# Patient Record
Sex: Female | Born: 1965 | Race: Black or African American | Hispanic: No | Smoking: Never smoker
Health system: Southern US, Community
[De-identification: ages and names within clinical notes are randomized; demographics above are authoritative.]

## PROBLEM LIST (undated history)

## (undated) DIAGNOSIS — I10 Essential (primary) hypertension: Secondary | ICD-10-CM

## (undated) DIAGNOSIS — M1990 Unspecified osteoarthritis, unspecified site: Secondary | ICD-10-CM

## (undated) DIAGNOSIS — D649 Anemia, unspecified: Secondary | ICD-10-CM

## (undated) DIAGNOSIS — K219 Gastro-esophageal reflux disease without esophagitis: Secondary | ICD-10-CM

## (undated) HISTORY — PX: SHOULDER SURGERY: SHX246

## (undated) HISTORY — PX: TUBAL LIGATION: SHX77

## (undated) HISTORY — PX: ABDOMINAL HYSTERECTOMY: SHX81

## (undated) HISTORY — PX: WISDOM TOOTH EXTRACTION: SHX21

## (undated) HISTORY — PX: ANKLE SURGERY: SHX546

---

## 1999-05-30 ENCOUNTER — Ambulatory Visit (HOSPITAL_BASED_OUTPATIENT_CLINIC_OR_DEPARTMENT_OTHER): Admission: RE | Admit: 1999-05-30 | Discharge: 1999-05-31 | Payer: Self-pay | Admitting: Orthopedic Surgery

## 1999-08-14 ENCOUNTER — Other Ambulatory Visit: Admission: RE | Admit: 1999-08-14 | Discharge: 1999-08-14 | Payer: Self-pay | Admitting: Family Medicine

## 2005-01-26 ENCOUNTER — Emergency Department (HOSPITAL_COMMUNITY): Admission: EM | Admit: 2005-01-26 | Discharge: 2005-01-26 | Payer: Self-pay | Admitting: Emergency Medicine

## 2005-04-06 ENCOUNTER — Emergency Department (HOSPITAL_COMMUNITY): Admission: EM | Admit: 2005-04-06 | Discharge: 2005-04-06 | Payer: Self-pay | Admitting: *Deleted

## 2006-01-30 ENCOUNTER — Ambulatory Visit: Payer: Self-pay | Admitting: Internal Medicine

## 2006-03-21 ENCOUNTER — Ambulatory Visit: Payer: Self-pay | Admitting: Internal Medicine

## 2006-03-21 ENCOUNTER — Encounter (INDEPENDENT_AMBULATORY_CARE_PROVIDER_SITE_OTHER): Payer: Self-pay | Admitting: Internal Medicine

## 2006-03-21 LAB — CONVERTED CEMR LAB

## 2006-10-08 ENCOUNTER — Encounter: Payer: Self-pay | Admitting: Internal Medicine

## 2006-10-08 DIAGNOSIS — D508 Other iron deficiency anemias: Secondary | ICD-10-CM | POA: Insufficient documentation

## 2006-10-08 DIAGNOSIS — A5901 Trichomonal vulvovaginitis: Secondary | ICD-10-CM | POA: Insufficient documentation

## 2007-06-18 ENCOUNTER — Ambulatory Visit (HOSPITAL_COMMUNITY): Admission: RE | Admit: 2007-06-18 | Discharge: 2007-06-18 | Payer: Self-pay | Admitting: Obstetrics and Gynecology

## 2007-06-19 ENCOUNTER — Ambulatory Visit (HOSPITAL_COMMUNITY): Admission: RE | Admit: 2007-06-19 | Discharge: 2007-06-19 | Payer: Self-pay | Admitting: Obstetrics and Gynecology

## 2007-11-05 ENCOUNTER — Ambulatory Visit (HOSPITAL_COMMUNITY): Admission: RE | Admit: 2007-11-05 | Discharge: 2007-11-05 | Payer: Self-pay | Admitting: Obstetrics and Gynecology

## 2009-01-06 ENCOUNTER — Ambulatory Visit (HOSPITAL_COMMUNITY): Admission: RE | Admit: 2009-01-06 | Discharge: 2009-01-06 | Payer: Self-pay | Admitting: Obstetrics and Gynecology

## 2009-01-06 ENCOUNTER — Emergency Department (HOSPITAL_COMMUNITY): Admission: EM | Admit: 2009-01-06 | Discharge: 2009-01-06 | Payer: Self-pay | Admitting: Emergency Medicine

## 2009-01-18 ENCOUNTER — Encounter: Admission: RE | Admit: 2009-01-18 | Discharge: 2009-01-18 | Payer: Self-pay | Admitting: Obstetrics and Gynecology

## 2009-04-03 ENCOUNTER — Encounter: Payer: Self-pay | Admitting: Obstetrics and Gynecology

## 2009-04-03 ENCOUNTER — Inpatient Hospital Stay (HOSPITAL_COMMUNITY): Admission: RE | Admit: 2009-04-03 | Discharge: 2009-04-05 | Payer: Self-pay | Admitting: Obstetrics and Gynecology

## 2009-05-28 IMAGING — MG MM DIGITAL SCREENING BILAT
6 series · 6 of 6 positions shown · non-contrast
Comparison: none

DG SCREEN MAMMOGRAM BILATERAL
Bilateral CC and MLO view(s) were taken.

DIGITAL SCREENING MAMMOGRAM WITH CAD:
The breast tissue is heterogeneously dense.  No masses or malignant type calcifications are 
identified.

[R CC]
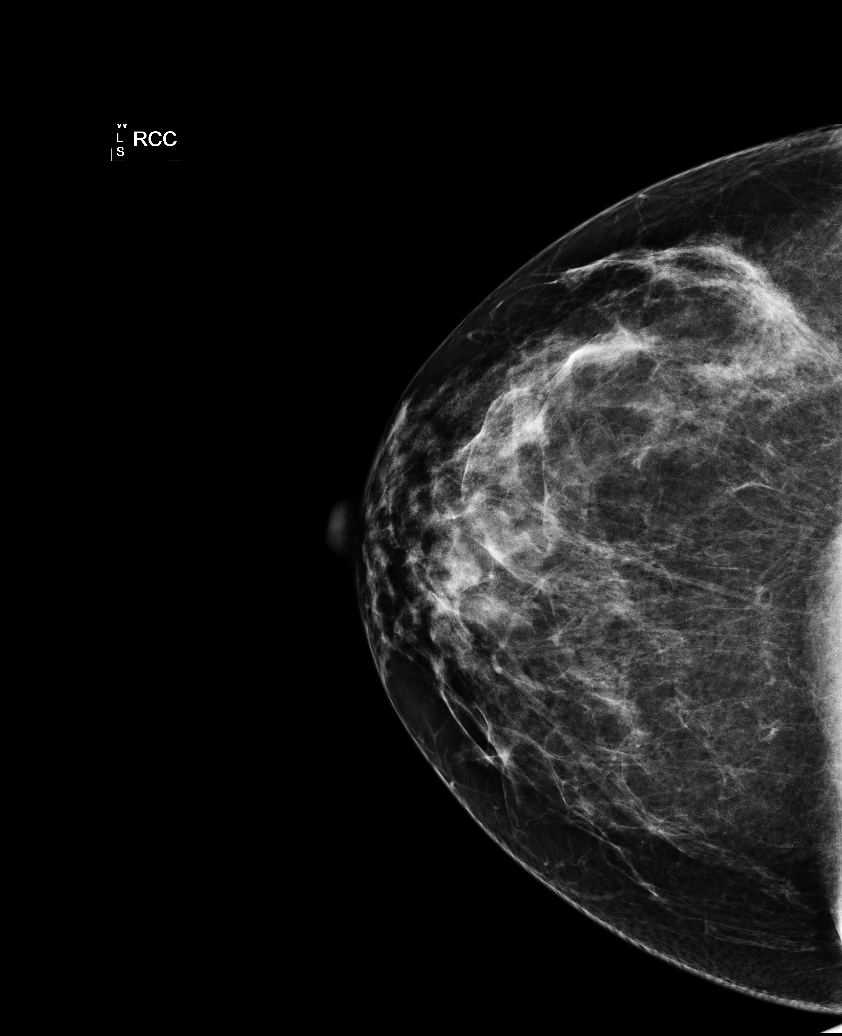

[R MLO (1 of 2)]
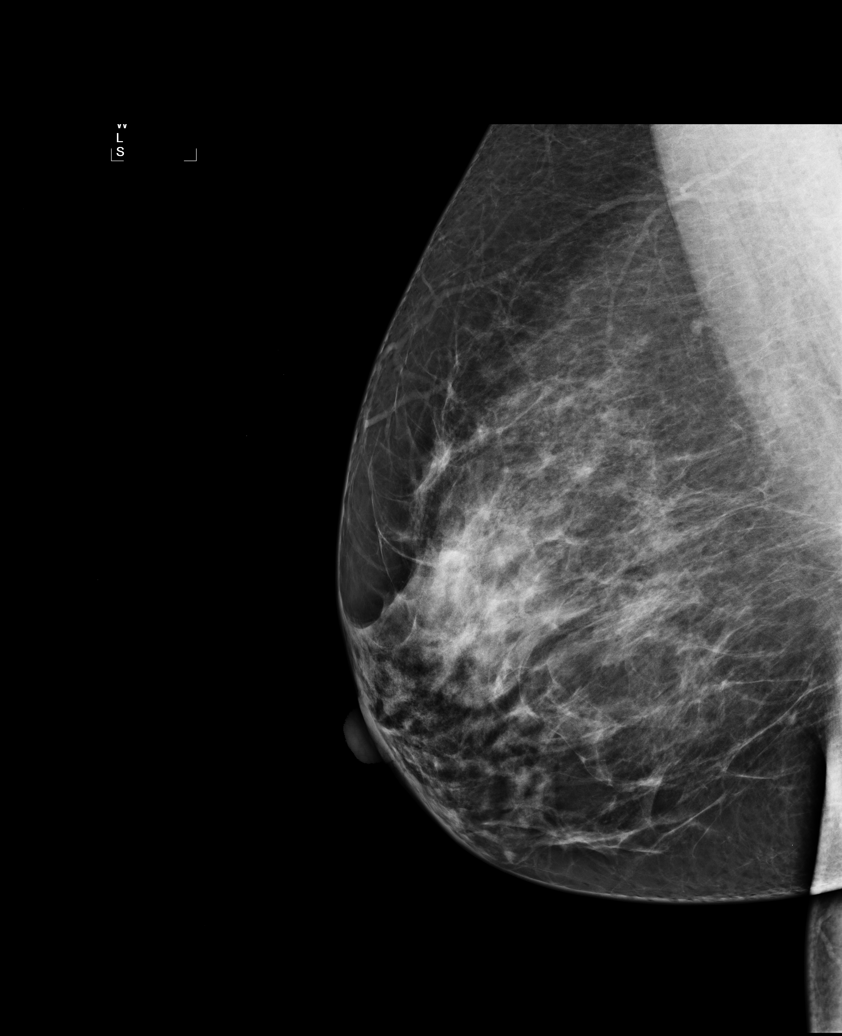

[L CC]
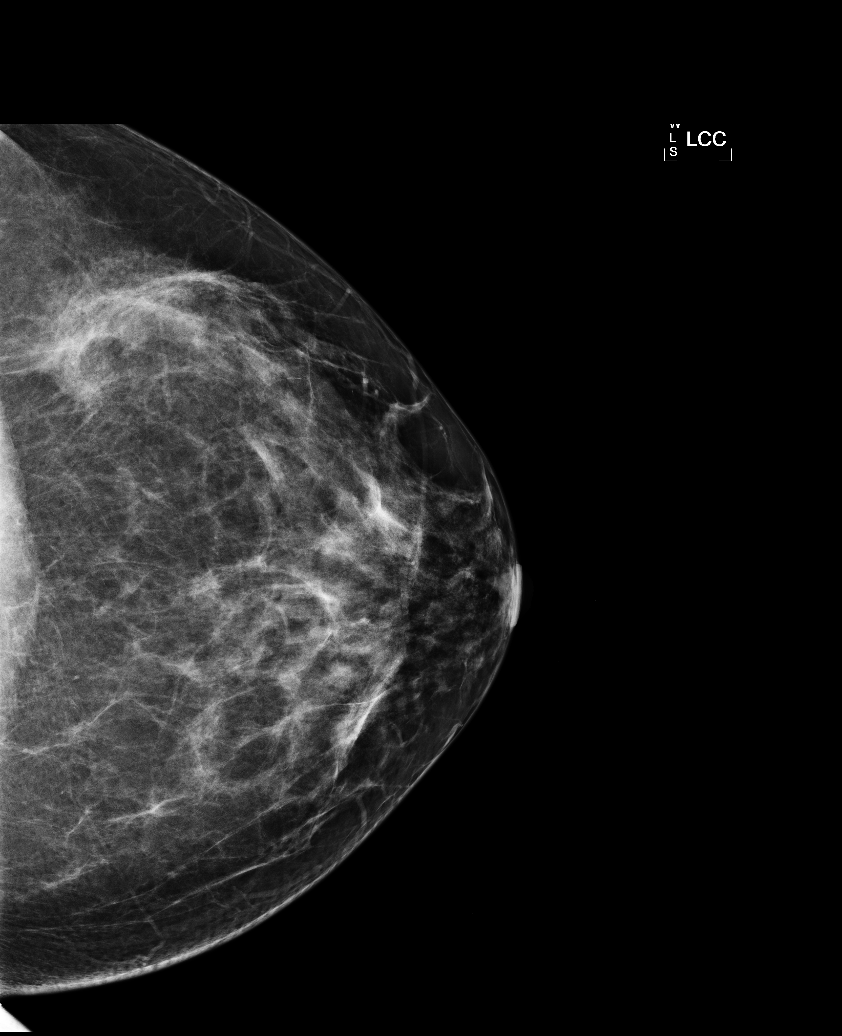

[L MLO (1 of 2)]
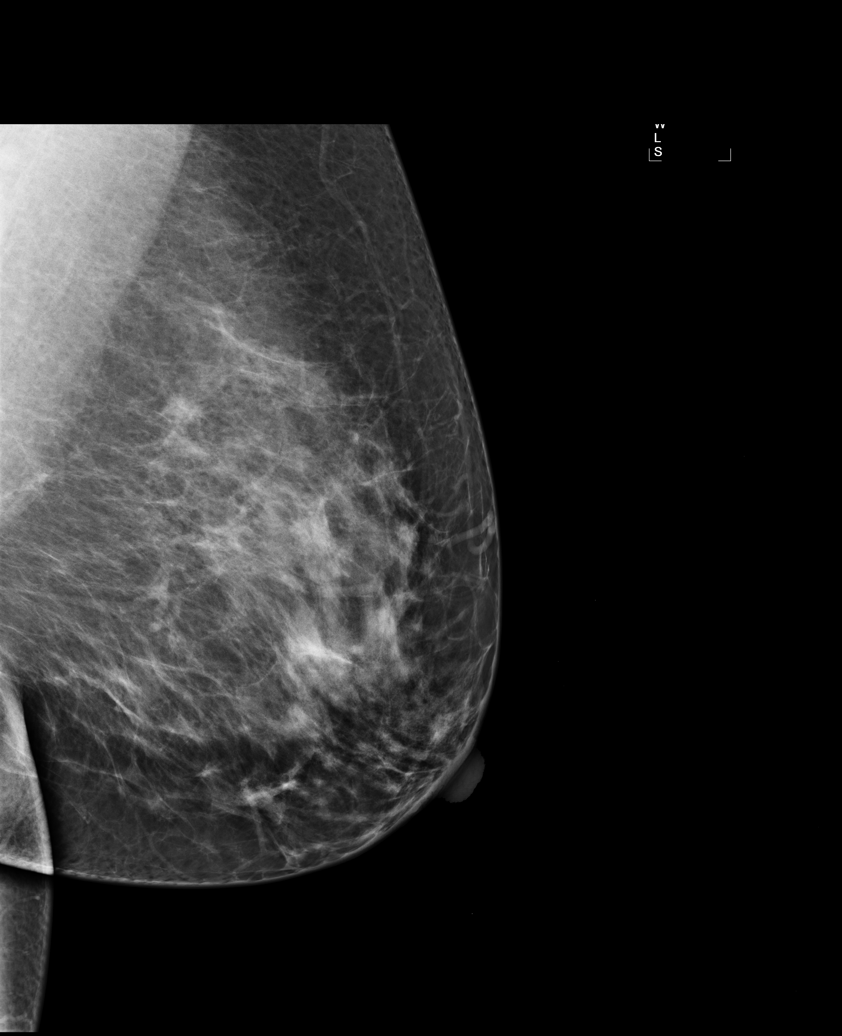

[L MLO (2 of 2)]
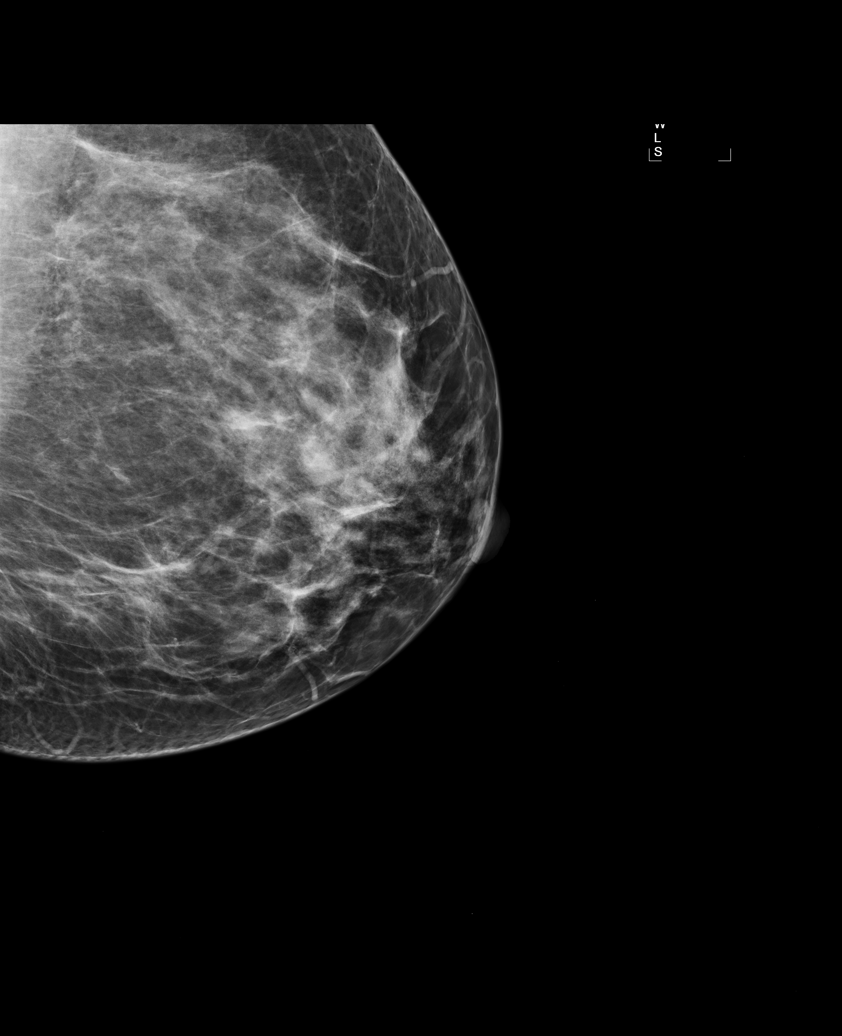

[R MLO (2 of 2)]
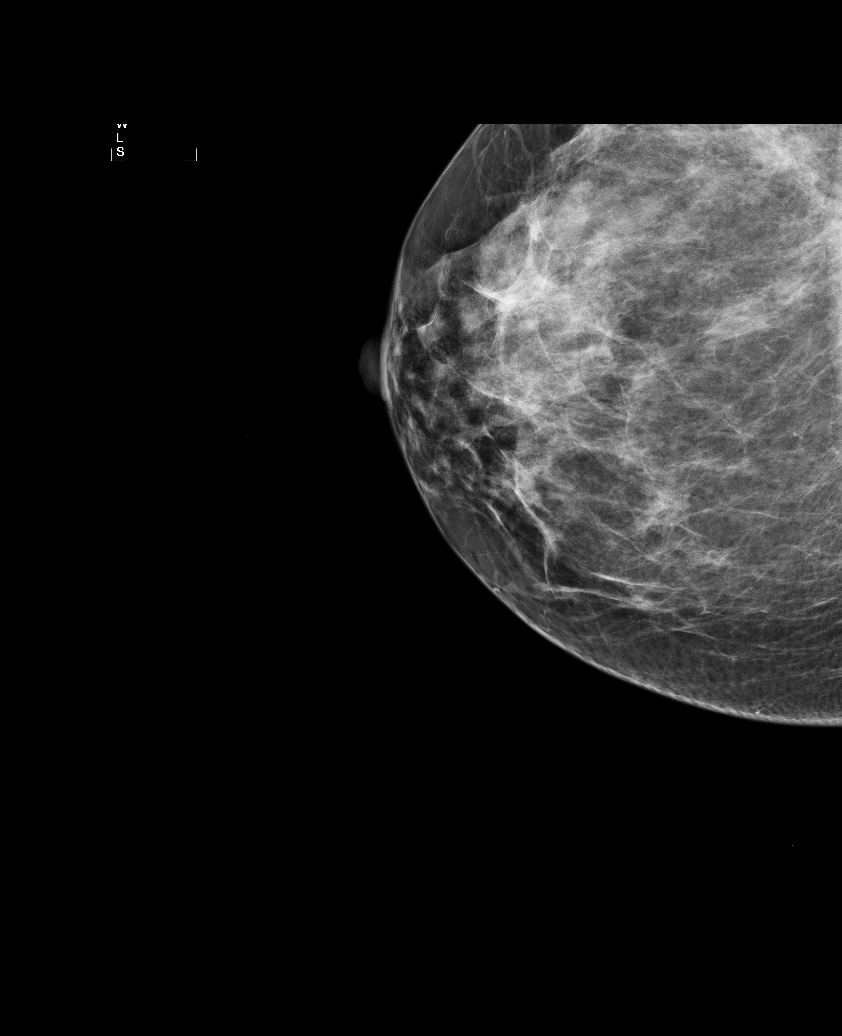

[6 of 6 positions shown; findings below may reference images not displayed]

IMPRESSION: No specific mammographic evidence of malignancy.  Next screening mammogram is recommended in one 
year.

ASSESSMENT: Negative - BI-RADS 1

Screening mammogram in 1 year.
ANALYZED BY COMPUTER AIDED DETECTION. , THIS PROCEDURE WAS A DIGITAL MAMMOGRAM.

## 2010-02-11 ENCOUNTER — Encounter: Payer: Self-pay | Admitting: Obstetrics and Gynecology

## 2010-04-16 LAB — CBC
HCT: 30.7 % — ABNORMAL LOW (ref 36.0–46.0)
MCHC: 32.1 g/dL (ref 30.0–36.0)
MCHC: 32.1 g/dL (ref 30.0–36.0)
MCV: 77.6 fL — ABNORMAL LOW (ref 78.0–100.0)
MCV: 79 fL (ref 78.0–100.0)
Platelets: 270 10*3/uL (ref 150–400)
RBC: 3.28 MIL/uL — ABNORMAL LOW (ref 3.87–5.11)
RBC: 3.96 MIL/uL (ref 3.87–5.11)
WBC: 17.3 10*3/uL — ABNORMAL HIGH (ref 4.0–10.5)

## 2010-06-08 NOTE — Op Note (Signed)
[place]. [facility]  Patient:    Wanda English, Wanda English                         MRN: [id number] Proc. Date: 05/30/99 Adm. Date:  [id number] Disc. Date: [id number] Attending:  Colbert Ewing                           Operative Report  PREOPERATIVE DIAGNOSIS:  Displaced lateral malleolus fracture with torn deltoid ligament, left ankle, with resultant instability.  POSTOPERATIVE DIAGNOSIS:  Displaced lateral malleolus fracture with torn deltoid ligament, left ankle, with resultant instability.  OPERATIVE PROCEDURE:  Closed reduction of subluxation, left ankle, with open reduction and internal fixation of lateral malleolus fracture, six-hole, one-third tubular titanium plate and screws.  SURGEON:  Loreta Ave, M.D.  ASSISTANT:  Arlys John D. Petrarca, P.A.-C.  ANESTHESIA:  General.  ESTIMATED BLOOD LOSS:  Minimal.  TOURNIQUET TIME:  30 minutes.  SPECIMENS:  None.  CULTURES:  None.  DRESSING:  Soft compressive with Cam walker.  DESCRIPTION OF PROCEDURE:  The patient was brought to the operating room and placed on the operating table in the supine position.  After adequate anesthesia had been obtained, the left ankle was examined.  Obvious medial instability with an external rotation injury, widened mortis, and displaced spiral fracture of lateral malleolus.  Syndesmosis not ruptured.  Able to close the medial side, confirming lack of interposition of tissue there. Tourniquet applied about the thigh.  Prepped and draped in usual sterile fashion.  Exsanguinated with elevation of Esmarch.  Tourniquet inflated to 350 mmHg.  Straight incision over the posterolateral aspect of the fibula. Subperiosteal exposure of the fibula, avoiding tendinous and neurovascular structures.  This was reduced anatomically after hematoma evacuated and the wound irrigated.  Fixed with a six-hole one-third tubular plate that was placed on the posterior aspect of the fibula.   The central screw of the three distal screws, as well as the proximal of the three distal screws were overdrilled to allow lagging of the fracture, as well as fixation of the plate.  The plate was fixed with three screws proximal to the fracture and then two across the fracture and one distal.  Used 3.5 mm screws in all of the holes, except for the most distal, which was 4.0 mm.  Care taken not to enter the joint with the drill bits or screws.  At completion, I had excellent anatomic alignment of the entire ankle without instability with stress. Anatomic alignment of the fibula.  No need to intervene on the medial side. The wound was irrigated and then closed with Vicryl and staples.  Margins of the wound injected with Marcaine.  Sterile compressive dressing and Cam walker applied.  Tourniquet deflated and removed.  Anesthesia reversed.  Brought to the recovery room.  Tolerated the surgery well with no complications. DD:  05/30/99 TD:  06/01/99 Job: [id number]

## 2013-09-22 ENCOUNTER — Other Ambulatory Visit: Payer: Self-pay

## 2013-09-22 DIAGNOSIS — Z1231 Encounter for screening mammogram for malignant neoplasm of breast: Secondary | ICD-10-CM

## 2013-10-04 ENCOUNTER — Ambulatory Visit
Admission: RE | Admit: 2013-10-04 | Discharge: 2013-10-04 | Disposition: A | Discharge status: Home / Self Care | Payer: BC Managed Care – PPO | Source: Ambulatory Visit

## 2013-10-04 DIAGNOSIS — Z1231 Encounter for screening mammogram for malignant neoplasm of breast: Secondary | ICD-10-CM

## 2015-05-30 ENCOUNTER — Other Ambulatory Visit: Payer: Self-pay

## 2015-05-30 DIAGNOSIS — Z1231 Encounter for screening mammogram for malignant neoplasm of breast: Secondary | ICD-10-CM

## 2015-06-12 ENCOUNTER — Ambulatory Visit
Admission: RE | Admit: 2015-06-12 | Discharge: 2015-06-12 | Disposition: A | Discharge status: Home / Self Care | Payer: BC Managed Care – PPO | Source: Ambulatory Visit

## 2015-06-12 DIAGNOSIS — Z1231 Encounter for screening mammogram for malignant neoplasm of breast: Secondary | ICD-10-CM

## 2015-07-22 ENCOUNTER — Encounter (HOSPITAL_COMMUNITY): Payer: Self-pay

## 2015-07-22 ENCOUNTER — Emergency Department (HOSPITAL_COMMUNITY): Payer: No Typology Code available for payment source

## 2015-07-22 ENCOUNTER — Emergency Department (HOSPITAL_COMMUNITY)
Admission: EM | Admit: 2015-07-22 | Discharge: 2015-07-22 | Disposition: A | Discharge status: Home / Self Care | Payer: No Typology Code available for payment source | Attending: Emergency Medicine | Admitting: Emergency Medicine

## 2015-07-22 DIAGNOSIS — Y999 Unspecified external cause status: Secondary | ICD-10-CM | POA: Insufficient documentation

## 2015-07-22 DIAGNOSIS — M25512 Pain in left shoulder: Secondary | ICD-10-CM | POA: Diagnosis not present

## 2015-07-22 DIAGNOSIS — Y92410 Unspecified street and highway as the place of occurrence of the external cause: Secondary | ICD-10-CM | POA: Insufficient documentation

## 2015-07-22 DIAGNOSIS — M79672 Pain in left foot: Secondary | ICD-10-CM | POA: Diagnosis not present

## 2015-07-22 DIAGNOSIS — M542 Cervicalgia: Secondary | ICD-10-CM

## 2015-07-22 DIAGNOSIS — Y9389 Activity, other specified: Secondary | ICD-10-CM | POA: Insufficient documentation

## 2015-07-22 DIAGNOSIS — M25532 Pain in left wrist: Secondary | ICD-10-CM | POA: Diagnosis not present

## 2015-07-22 DIAGNOSIS — Z79899 Other long term (current) drug therapy: Secondary | ICD-10-CM | POA: Insufficient documentation

## 2015-07-22 MED ORDER — CYCLOBENZAPRINE HCL 10 MG PO TABS
10.0000 mg | ORAL_TABLET | Freq: Once | ORAL | Status: AC
  Administered 2015-07-22: 10 mg via ORAL
  Filled 2015-07-22: qty 1

## 2015-07-22 MED ORDER — IBUPROFEN 800 MG PO TABS
800.0000 mg | ORAL_TABLET | Freq: Once | ORAL | Status: AC
Start: 2015-07-22 — End: 2015-07-22
  Administered 2015-07-22: 800 mg via ORAL
  Filled 2015-07-22: qty 1

## 2015-07-22 MED ORDER — NAPROXEN 500 MG PO TABS: ORAL_TABLET | ORAL | Status: DC

## 2015-07-22 MED ORDER — CYCLOBENZAPRINE HCL 5 MG PO TABS: 5.0000 mg | ORAL_TABLET | Freq: Three times a day (TID) | ORAL | Status: DC | PRN
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
## 2015-08-04 ENCOUNTER — Other Ambulatory Visit: Payer: Self-pay | Admitting: Gastroenterology

## 2015-09-05 ENCOUNTER — Emergency Department (HOSPITAL_BASED_OUTPATIENT_CLINIC_OR_DEPARTMENT_OTHER): Payer: BC Managed Care – PPO

## 2015-09-05 ENCOUNTER — Encounter (HOSPITAL_BASED_OUTPATIENT_CLINIC_OR_DEPARTMENT_OTHER): Payer: Self-pay | Admitting: *Deleted

## 2015-09-05 ENCOUNTER — Emergency Department (HOSPITAL_BASED_OUTPATIENT_CLINIC_OR_DEPARTMENT_OTHER)
Admission: EM | Admit: 2015-09-05 | Discharge: 2015-09-05 | Disposition: A | Discharge status: Home / Self Care | Payer: BC Managed Care – PPO | Attending: Emergency Medicine | Admitting: Emergency Medicine

## 2015-09-05 DIAGNOSIS — R51 Headache: Secondary | ICD-10-CM | POA: Diagnosis present

## 2015-09-05 DIAGNOSIS — G44219 Episodic tension-type headache, not intractable: Secondary | ICD-10-CM

## 2015-09-05 LAB — CBC WITH DIFFERENTIAL/PLATELET
BASOS ABS: 0 10*3/uL (ref 0.0–0.1)
BASOS PCT: 0 %
EOS ABS: 0.2 10*3/uL (ref 0.0–0.7)
EOS PCT: 2 %
HCT: 39.8 % (ref 36.0–46.0)
Hemoglobin: 13.2 g/dL (ref 12.0–15.0)
LYMPHS PCT: 36 %
Lymphs Abs: 3 10*3/uL (ref 0.7–4.0)
MCH: 28.8 pg (ref 26.0–34.0)
MCHC: 33.2 g/dL (ref 30.0–36.0)
MCV: 86.9 fL (ref 78.0–100.0)
Monocytes Absolute: 1 10*3/uL (ref 0.1–1.0)
Monocytes Relative: 12 %
Neutro Abs: 4.3 10*3/uL (ref 1.7–7.7)
Neutrophils Relative %: 50 %
PLATELETS: 261 10*3/uL (ref 150–400)
RBC: 4.58 MIL/uL (ref 3.87–5.11)
RDW: 14.6 % (ref 11.5–15.5)
WBC: 8.5 10*3/uL (ref 4.0–10.5)

## 2015-09-05 LAB — URINALYSIS, ROUTINE W REFLEX MICROSCOPIC
BILIRUBIN URINE: NEGATIVE
Glucose, UA: NEGATIVE mg/dL
HGB URINE DIPSTICK: NEGATIVE
Ketones, ur: NEGATIVE mg/dL
LEUKOCYTES UA: NEGATIVE
Nitrite: NEGATIVE
PH: 6 (ref 5.0–8.0)
Protein, ur: NEGATIVE mg/dL
SPECIFIC GRAVITY, URINE: 1.02 (ref 1.005–1.030)

## 2015-09-05 LAB — COMPREHENSIVE METABOLIC PANEL
ALBUMIN: 3.4 g/dL — AB (ref 3.5–5.0)
ALT: 14 U/L (ref 14–54)
AST: 15 U/L (ref 15–41)
Alkaline Phosphatase: 79 U/L (ref 38–126)
Anion gap: 7 (ref 5–15)
BUN: 10 mg/dL (ref 6–20)
CHLORIDE: 103 mmol/L (ref 101–111)
CO2: 26 mmol/L (ref 22–32)
CREATININE: 1.07 mg/dL — AB (ref 0.44–1.00)
Calcium: 8.8 mg/dL — ABNORMAL LOW (ref 8.9–10.3)
GFR calc Af Amer: >60 mL/min (ref >60)
GFR calc non Af Amer: 59 mL/min — ABNORMAL LOW (ref >60)
GLUCOSE: 91 mg/dL (ref 65–99)
POTASSIUM: 3.8 mmol/L (ref 3.5–5.1)
SODIUM: 136 mmol/L (ref 135–145)
Total Bilirubin: 0.4 mg/dL (ref 0.3–1.2)
Total Protein: 7.5 g/dL (ref 6.5–8.1)

## 2015-09-05 LAB — LIPASE, BLOOD: Lipase: 23 U/L (ref 11–51)

## 2015-09-05 MED ORDER — NAPROXEN 500 MG PO TABS: 500.0000 mg | ORAL_TABLET | Freq: Two times a day (BID) | ORAL | 0 refills | Status: DC

## 2015-09-05 MED ORDER — SODIUM CHLORIDE 0.9 % IV BOLUS (SEPSIS)
1000.0000 mL | Freq: Once | INTRAVENOUS | Status: AC
  Administered 2015-09-05: 1000 mL via INTRAVENOUS

## 2015-09-05 MED ORDER — PROCHLORPERAZINE EDISYLATE 5 MG/ML IJ SOLN
10.0000 mg | Freq: Once | INTRAMUSCULAR | Status: AC
  Administered 2015-09-05: 10 mg via INTRAVENOUS
  Filled 2015-09-05: qty 2

## 2015-09-05 MED ORDER — DIPHENHYDRAMINE HCL 50 MG/ML IJ SOLN
25.0000 mg | Freq: Once | INTRAMUSCULAR | Status: AC
  Administered 2015-09-05: 25 mg via INTRAVENOUS
  Filled 2015-09-05: qty 1

## 2015-09-05 MED ORDER — DEXAMETHASONE SODIUM PHOSPHATE 10 MG/ML IJ SOLN
10.0000 mg | Freq: Once | INTRAMUSCULAR | Status: AC
  Administered 2015-09-05: 10 mg via INTRAVENOUS
  Filled 2015-09-05: qty 1

## 2015-09-05 NOTE — Discharge Instructions (Signed)
You have been seen today for a headache. Your imaging and lab tests showed no acute abnormalities. Follow up with neurology as soon as possible for reevaluation and chronic management of this issue. Follow up with PCP as needed. Return to ED should symptoms worsen. Use ibuprofen, naproxen, or Tylenol for pain.

## 2015-09-05 NOTE — ED Triage Notes (Signed)
Headache for a week. Nausea and vomiting. Lightheaded x 2 days.

## 2015-09-05 NOTE — ED Provider Notes (Signed)
[facility] Provider Note   CSN: [id number] Arrival date & time: 09/05/15  1424     History   Chief Complaint Chief Complaint  Patient presents with  . Migraine    HPI Wanda GableBarbara P English is a 50 y.o. female.  Wanda GableBarbara P Elsbernd Patient is a 50 year old female who presents emergency Department with chief complaint of headache. Patient states that her headache began about one week week ago. She describes it as throbbing pain behind both of her eyes and neck pain. She states that it has been progressively worsening over the last week. 3 days ago she began having nausea and vomiting, which she associated with the headache. She states it is improved 2 days ago, but yesterday she went to have her neck adjusted by the chiropractor and her headache improved somewhat but then returned and became significantly worse again. She has tried ibuprofen, Claritin and 12.5 mg of Benadryl at bed time without relief of her symptoms. She has no previous history of headaches. She has no history of migraines. Denies photophobia, phonophobia, UL throbbing, N/V, visual changes, stiff neck, neck pain, rash, or "thunderclap" onset.     The history is provided by the patient.  Migraine  The current episode started more than 1 week ago. The problem occurs constantly. The problem has been gradually worsening. Associated symptoms include headaches. Pertinent negatives include no chest pain, no abdominal pain and no shortness of breath. Associated symptoms comments: Intermittent blurry vision vomiting. Nothing aggravates the symptoms. Relieved by: mild relief with Ibuprofen.    History reviewed. No pertinent past medical history.  Patient Active Problem List   Diagnosis Date Noted  . 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 ANEMIA, IRON DEFICIENCY NEC 10/08/2006    Past Surgical History:  Procedure Laterality Date  . ABDOMINAL HYSTERECTOMY    . ANKLE SURGERY    . TUBAL LIGATION      OB History    No  data available       Home Medications    Prior to Admission medications   Medication Sig Start Date End Date Taking? Authorizing Provider  cyclobenzaprine (FLEXERIL) 5 MG tablet Take 1 tablet (5 mg total) by mouth 3 (three) times daily as needed (muscle soreness). 07/22/15   Devoria AlbeIva Knapp, MD  fluticasone-salmeterol (ADVAIR HFA) [phone] MCG/ACT inhaler Inhale 2 puffs into the lungs 2 (two) times daily.    Historical Provider, MD  loratadine (CLARITIN) 10 MG tablet Take 10 mg by mouth daily.    Historical Provider, MD  naproxen (NAPROSYN) 500 MG tablet Take 1 po BID with food prn pain 07/22/15   Devoria AlbeIva Knapp, MD    Family History No family history on file.  Social History Social History  Substance Use Topics  . Smoking status: Never Smoker  . Smokeless tobacco: Never Used  . Alcohol use No     Allergies   Review of patient's allergies indicates no known allergies.   Review of Systems Review of Systems  Respiratory: Negative for shortness of breath.   Cardiovascular: Negative for chest pain.  Gastrointestinal: Negative for abdominal pain.  Neurological: Positive for headaches.     Physical Exam Updated Vital Signs BP 137/87 (BP Location: Left Arm)   Pulse 77   Temp 98.5 F (36.9 C) (Oral)   Resp 18   Ht 5\' 11"  (1.803 m)   Wt 129.3 kg   SpO2 100%   BMI 39.75 kg/m   Physical Exam  Constitutional: She is oriented to person, place, and  time. She appears well-developed and well-nourished. No distress.  HENT:  Head: Normocephalic and atraumatic.  Mouth/Throat: Oropharynx is clear and moist.  Eyes: Conjunctivae and EOM are normal. Pupils are equal, round, and reactive to light. No scleral icterus.  No horizontal, vertical or rotational nystagmus  Neck: Normal range of motion. Neck supple.  Full active and passive ROM without pain No midline or paraspinal tenderness No nuchal rigidity or meningeal signs  Cardiovascular: Normal rate, regular rhythm and intact distal pulses.     Pulmonary/Chest: Effort normal and breath sounds normal. No respiratory distress. She has no wheezes. She has no rales.  Abdominal: Soft. Bowel sounds are normal. There is no tenderness. There is no rebound and no guarding.  Musculoskeletal: Normal range of motion.  Lymphadenopathy:    She has no cervical adenopathy.  Neurological: She is alert and oriented to person, place, and time. She has normal reflexes. No cranial nerve deficit. She exhibits normal muscle tone. Coordination normal.  Mental Status:  Alert, oriented, thought content appropriate. Speech fluent without evidence of aphasia. Able to follow 2 step commands without difficulty.  Cranial Nerves:  II:  Peripheral visual fields grossly normal, pupils equal, round, reactive to light III,IV, VI: ptosis not present, extra-ocular motions intact bilaterally  V,VII: smile symmetric, facial light touch sensation equal VIII: hearing grossly normal bilaterally  IX,X: midline uvula rise  XI: bilateral shoulder shrug equal and strong XII: midline tongue extension  Motor:  5/5 in upper and lower extremities bilaterally including strong and equal grip strength and dorsiflexion/plantar flexion Sensory: Pinprick and light touch normal in all extremities.  Deep Tendon Reflexes: 2+ and symmetric  Cerebellar: normal finger-to-nose with bilateral upper extremities Gait: normal gait and balance CV: distal pulses palpable throughout   Skin: Skin is warm and dry. No rash noted. She is not diaphoretic.  Psychiatric: She has a normal mood and affect. Her behavior is normal. Judgment and thought content normal.  Nursing note and vitals reviewed.    ED Treatments / Results  Labs (all labs ordered are listed, but only abnormal results are displayed) Labs Reviewed - No data to display  EKG  EKG Interpretation None       Radiology No results found.  Procedures Procedures (including critical care time)  Medications Ordered in  ED Medications - No data to display   Initial Impression / Assessment and Plan / ED Course  I have reviewed the triage vital signs and the nursing notes.  Pertinent labs & imaging results that were available during my care of the patient were reviewed by me and considered in my medical decision making (see chart for details).  Clinical Course   Patient with persistent headache. Treated with migraine cocktail. imaging pending . I have given report to PA Joy who will assume care.    Final Clinical Impressions(s) / ED Diagnoses   Final diagnoses:  None    New Prescriptions New Prescriptions   No medications on file     Arthor Captainbigail Virgal [place], PA-C 09/05/15 1609    Melene Planan Floyd, DO 09/07/15 1611

## 2015-09-05 NOTE — ED Provider Notes (Signed)
Norval GableBarbara P Lampley is a 50 y.o. female, patient with no pertinent past medical history, presenting to the ED with a headache for the past week. No history of HA.  HPI from Arthor CaptainAbigail Harris, PA-C: "Patient is a 50 year old female who presents emergency Department with chief complaint of headache. Patient states that her headache began about one week week ago. She describes it as throbbing pain behind both of her eyes and neck pain. She states that it has been progressively worsening over the last week. 3 days ago she began having nausea and vomiting, which she associated with the headache. She states it is improved 2 days ago, but yesterday she went to have her neck adjusted by the chiropractor and her headache improved somewhat but then returned and became significantly worse again. She has tried ibuprofen, Claritin and 12.5 mg of Benadryl at bed time without relief of her symptoms. She has no previous history of headaches. She has no history of migraines. Denies photophobia, phonophobia, UL throbbing, N/V, visual changes, stiff neck, neck pain, rash, or "thunderclap" onset."  History reviewed. No pertinent past medical history.    Physical Exam  BP 137/87 (BP Location: Left Arm)   Pulse 77   Temp 98.5 F (36.9 C) (Oral)   Resp 18   Ht 5\' 11"  (1.803 m)   Wt 129.3 kg   SpO2 100%   BMI 39.75 kg/m   Physical Exam  Constitutional: She is oriented to person, place, and time. She appears well-developed and well-nourished. No distress.  HENT:  Head: Normocephalic and atraumatic.  Eyes: Conjunctivae and EOM are normal. Pupils are equal, round, and reactive to light.    Visual Acuity  Right Eye Distance: 20/25 (with glasses) Left Eye Distance: 20/25 (with glasses) Bilateral Distance: 20/25 (with glasses)  Right Eye Near:   Left Eye Near:    Bilateral Near:     Neck: Normal range of motion. Neck supple.  Cardiovascular: Normal rate, regular rhythm, normal heart sounds and intact distal pulses.    Pulmonary/Chest: Effort normal and breath sounds normal. No respiratory distress.  Abdominal: Soft. There is no tenderness. There is no guarding.  Musculoskeletal: Normal range of motion. She exhibits no edema.  Full ROM in all extremities and spine. No midline spinal tenderness.   Lymphadenopathy:    She has no cervical adenopathy.  Neurological: She is alert and oriented to person, place, and time. She has normal reflexes.  No sensory deficits. Strength 5/5 in all extremities. Coordination intact. Cranial nerves III-XII grossly intact. No facial droop.   Skin: Skin is warm and dry. She is not diaphoretic.  Psychiatric: She has a normal mood and affect. Her behavior is normal.  Nursing note and vitals reviewed.     ED Course  Procedures   Labs Reviewed  COMPREHENSIVE METABOLIC PANEL - Abnormal; Notable for the following:       Result Value   Creatinine, Ser 1.07 (*)    Calcium 8.8 (*)    Albumin 3.4 (*)    GFR calc non Af Amer 59 (*)    All other components within normal limits  CBC WITH DIFFERENTIAL/PLATELET  LIPASE, BLOOD  URINALYSIS, ROUTINE W REFLEX MICROSCOPIC (NOT AT [facility])    Ct Head Wo Contrast  Result Date: 09/05/2015 CLINICAL DATA:  Headache EXAM: CT HEAD WITHOUT CONTRAST TECHNIQUE: Contiguous axial images were obtained from the base of the skull through the vertex without intravenous contrast. COMPARISON:  None. FINDINGS: Brain: Ventricle size normal. Negative for acute hemorrhage. Negative  for acute infarct or mass. Vascular: Negative Skull: Negative Sinuses/Orbits: Slight mucosal edema in the paranasal sinuses. Mastoid sinus clear bilaterally. Other: Normal soft tissues. IMPRESSION: Normal unenhanced CT of the brain. Electronically Signed   By: Marlan Palauharles  Clark M.D.   On: 09/05/2015 17:19    MDM    Took patient care handoff report from Arthor CaptainAbigail Harris, PA-C. Headache for past week. No history of HA.  Plan: review CT and labs. D/C home with NSAID. PCP follow  up.  Findings and plan of care discussed with Loren Raceravid Yelverton. Dr. Ranae PalmsYelverton personally evaluated and examined this patient.  Upon my initial assessment, patient states her pain has greatly improved and is now rated 3 out of 10. Denies additional symptoms. CT shows no abnormalities. Labs without significant abnormalities. No red flag symptoms. No neuro or functional deficits. Suspect tension-type headache versus migraine. Patient to follow up with neurology. The patient was given instructions for home care as well as return precautions. Patient voices understanding of these instructions, accepts the plan, and is comfortable with discharge.  Vitals:   09/05/15 1427 09/05/15 1430 09/05/15 1432 09/05/15 1648  BP:   137/87 106/63  Pulse:   77 88  Resp:  14 18 18   Temp:   98.5 F (36.9 C)   TempSrc:  Oral Oral   SpO2:   100% 100%  Weight: 129.3 kg     Height: 5\' 11"  (1.803 m)      Vitals:   09/05/15 1430 09/05/15 1432 09/05/15 1648 09/05/15 1838  BP:  137/87 106/63 120/79  Pulse:  77 88 85  Resp: 14 18 18 16   Temp:  98.5 F (36.9 C)    TempSrc: Oral Oral    SpO2:  100% 100% 100%  Weight:      Height:            Anselm PancoastShawn C Joy, PA-C 09/05/15 1955    Melene Planan Floyd, DO 09/07/15 1506

## 2015-09-05 NOTE — ED Notes (Signed)
CT made aware pt's lab results are back
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
## 2015-10-13 ENCOUNTER — Telehealth: Payer: Self-pay | Admitting: *Deleted

## 2015-10-13 ENCOUNTER — Ambulatory Visit: Payer: Self-pay | Admitting: Diagnostic Neuroimaging

## 2015-10-13 NOTE — Telephone Encounter (Signed)
No show - new patient appt - called morning of appt to cancel - did not have co-pay.

## 2015-10-16 ENCOUNTER — Encounter: Payer: Self-pay | Admitting: Diagnostic Neuroimaging

## 2016-05-06 ENCOUNTER — Other Ambulatory Visit: Payer: Self-pay | Admitting: Family Medicine

## 2016-05-06 DIAGNOSIS — Z1231 Encounter for screening mammogram for malignant neoplasm of breast: Secondary | ICD-10-CM

## 2016-06-12 ENCOUNTER — Ambulatory Visit: Payer: BC Managed Care – PPO

## 2016-06-25 ENCOUNTER — Ambulatory Visit
Admission: RE | Admit: 2016-06-25 | Discharge: 2016-06-25 | Disposition: A | Discharge status: Home / Self Care | Payer: BC Managed Care – PPO | Source: Ambulatory Visit | Attending: Family Medicine | Admitting: Family Medicine

## 2016-06-25 DIAGNOSIS — Z1231 Encounter for screening mammogram for malignant neoplasm of breast: Secondary | ICD-10-CM

## 2016-10-18 ENCOUNTER — Ambulatory Visit: Payer: Self-pay | Admitting: Surgery

## 2016-10-18 NOTE — H&P (Signed)
History of Present Illness Wanda English. Wanda Izzo MD; 10/18/2016 12:43 PM) The patient is a 51 year old female who presents for evaluation of gall stones. Referred by [name], PA-C for symptomatic gallstones PCP Dr. [name]  This is a 51 year old female who experienced an acute episode of upper abdominal pain about 1 month ago. She was having a steak dinner when she began to have significant nausea and developed severe upper abdominal pain. She began vomiting. She denies any diarrhea. The pain became quite severe and lasted at least 5 days. Reports a low-grade fever during this time. After 3 or 4 days she went to urgent care for evaluation. White count was normal. Liver function tests were normal. Lipase was normal. She was sent for ultrasound which was performed at [facility] on 09/30/16. This showed cholelithiasis with some gallbladder wall thickening. The patient's symptoms have resolved. She does not have any further abdominal tenderness. She is now referred to [place] for surgical evaluation for cholecystectomy. She is a bit reluctant to consider surgery.   Past Surgical History [name], [place]; 10/18/2016 9:57 AM) Hysterectomy (not due to cancer) - Partial Oral Surgery  Diagnostic Studies History [name], [place]; 10/18/2016 9:57 AM) Colonoscopy 1-5 years ago Mammogram within last year Pap Smear >5 years ago  Allergies [name], RMA; 10/18/2016 10:00 AM) No Known Drug Allergies 10/18/2016  Medication History [name], RMA; 10/18/2016 10:00 AM) Promethazine HCl (  Tablet, Oral) Active. Meloxicam (  Tablet, Oral) Active. Gabapentin (  Capsule, Oral) Active. Hydrocodone-Acetaminophen (5-325MG  Tablet, Oral) Active.  Social History [name], [place]; 10/18/2016 9:58 AM) Alcohol use Remotely quit alcohol use. Caffeine use Carbonated beverages, Coffee, Tea. Tobacco use Never smoker.  Family History [name],  [place]; 10/18/2016 9:57 AM) Family history unknown First Degree Relatives  Pregnancy / Birth History [name], [place]; 10/18/2016 9:58 AM) Age at menarche 12 years. Gravida 3 Irregular periods Maternal age 66-20 Para 3  Other Problems [name], [place]; 10/18/2016 9:57 AM) Back Pain Hemorrhoids     Review of Systems [name] RMA; 10/18/2016 9:58 AM) General Not Present- Appetite Loss, Chills, Fatigue, Fever, Night Sweats, Weight Gain and Weight Loss. Skin Not Present- Change in Wart/Mole, Dryness, Hives, Jaundice, New Lesions, Non-Healing Wounds, Rash and Ulcer. HEENT Present- Sinus Pain and Wears glasses/contact lenses. Not Present- Earache, Hearing Loss, Hoarseness, Nose Bleed, Oral Ulcers, Ringing in the Ears, Seasonal Allergies, Sore Throat, Visual Disturbances and Yellow Eyes. Respiratory Not Present- Bloody sputum, Chronic Cough, Difficulty Breathing, Snoring and Wheezing. Breast Not Present- Breast Mass, Breast Pain, Nipple Discharge and Skin Changes. Gastrointestinal Not Present- Abdominal Pain, Bloating, Bloody Stool, Change in Bowel Habits, Chronic diarrhea, Constipation, Difficulty Swallowing, Excessive gas, Gets full quickly at meals, Hemorrhoids, Indigestion, Nausea, Rectal Pain and Vomiting. Female Genitourinary Not Present- Frequency, Nocturia, Painful Urination, Pelvic Pain and Urgency. Musculoskeletal Present- Back Pain, Joint Stiffness and Swelling of Extremities. Not Present- Joint Pain, Muscle Pain and Muscle Weakness. Neurological Not Present- Decreased Memory, Fainting, Headaches, Numbness, Seizures, Tingling, Tremor, Trouble walking and Weakness. Psychiatric Not Present- Anxiety, Bipolar, Change in Sleep Pattern, Depression, Fearful and Frequent crying. Endocrine Present- Hot flashes. Not Present- Cold Intolerance, Excessive Hunger, Hair Changes, Heat Intolerance and New Diabetes. Hematology Not Present- Blood Thinners, Easy Bruising,  Excessive bleeding, Gland problems, HIV and Persistent Infections.  Vitals [name] RMA; 10/18/2016 10:00 AM) 10/18/2016 9:58 AM Weight: 282.2 lb Height: 71in Body Surface Area: 2.44 m Body Mass Index: 39.36 kg/m  Temp.: 96.50F  Pulse: 82 (Regular)  BP:  160/100 (Sitting, Left Arm, Standard)      Physical Exam [name] MD; 10/18/2016 12:43 PM)  The physical exam findings are as follows: Note:WDWN in NAD Eyes: Pupils equal, round; sclera anicteric HENT: Oral mucosa moist; good dentition Neck: No masses palpated, no thyromegaly Lungs: CTA bilaterally; normal respiratory effort CV: Regular rate and rhythm; no murmurs; extremities well-perfused with no edema Abd: +bowel sounds, soft, non-tender, no palpable organomegaly; no palpable hernias Skin: Warm, dry; no sign of jaundice Psychiatric - alert and oriented x 4; calm mood and affect    Assessment & Plan [name] MD; 10/18/2016 10:29 AM)  CHRONIC CHOLECYSTITIS WITH CALCULUS (K80.10)  Current Plans Schedule for Surgery - Laparoscopic cholecystectomy with intraoperative cholangiogram. The surgical procedure has been discussed with the patient. Potential risks, benefits, alternative treatments, and expected outcomes have been explained. All of the patient's questions at this time have been answered. The likelihood of reaching the patient's treatment goal is good. The patient understand the proposed surgical procedure and wishes to proceed.  Wanda English. Corliss Skains, MD, [facility]  General/ Trauma Surgery  10/18/2016 12:43 PM

## 2016-12-03 ENCOUNTER — Other Ambulatory Visit: Payer: Self-pay

## 2016-12-03 ENCOUNTER — Encounter (HOSPITAL_BASED_OUTPATIENT_CLINIC_OR_DEPARTMENT_OTHER): Payer: Self-pay | Admitting: *Deleted

## 2016-12-03 ENCOUNTER — Emergency Department (HOSPITAL_BASED_OUTPATIENT_CLINIC_OR_DEPARTMENT_OTHER)
Admission: EM | Admit: 2016-12-03 | Discharge: 2016-12-03 | Disposition: A | Discharge status: Home / Self Care | Payer: BC Managed Care – PPO | Attending: Physician Assistant | Admitting: Physician Assistant

## 2016-12-03 DIAGNOSIS — Z79899 Other long term (current) drug therapy: Secondary | ICD-10-CM | POA: Diagnosis not present

## 2016-12-03 DIAGNOSIS — R059 Cough, unspecified: Secondary | ICD-10-CM

## 2016-12-03 DIAGNOSIS — R51 Headache: Secondary | ICD-10-CM | POA: Diagnosis not present

## 2016-12-03 DIAGNOSIS — J029 Acute pharyngitis, unspecified: Secondary | ICD-10-CM | POA: Insufficient documentation

## 2016-12-03 DIAGNOSIS — R05 Cough: Secondary | ICD-10-CM | POA: Insufficient documentation

## 2016-12-03 DIAGNOSIS — J3489 Other specified disorders of nose and nasal sinuses: Secondary | ICD-10-CM | POA: Diagnosis present

## 2016-12-03 DIAGNOSIS — J0100 Acute maxillary sinusitis, unspecified: Secondary | ICD-10-CM | POA: Diagnosis not present

## 2016-12-03 MED ORDER — BENZONATATE 100 MG PO CAPS: 100.0000 mg | ORAL_CAPSULE | Freq: Three times a day (TID) | ORAL | 0 refills | Status: DC

## 2016-12-03 MED FILL — BENZONATATE 100 MG CAPSULE: 100 | 7 days supply | Qty: 21 | Fill #0

## 2016-12-03 NOTE — ED Provider Notes (Signed)
[facility] EMERGENCY DEPARTMENT Provider Note   CSN: [id number] Arrival date & time: 12/03/16  1337     History   Chief Complaint Chief Complaint  Patient presents with  . URI    HPI Wanda English is a 51 y.o. female.  Wanda English is a 51 y.o. Female congestion, rhinorrhea and for the past 5 days.  Patient reports symptoms started on Friday with sore throat, which has since improved.  Nasal congestion and cough has gotten progressively worse the patient and her voice was very hoarse and she can barely talk.  Patient reports persistent dry nonproductive cough. Patient also complaining of sinus pressure with associated frontal headache.  Patient denies any fevers or chills.  Denies ear pain or discharge.  Denies any chest pain or shortness of breath.  Reports history of sinus congestion, Claritin, and Tylenol with minimal improvement.       History reviewed. No pertinent past medical history.  Patient Active Problem List   Diagnosis Date Noted  . 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 ANEMIA, IRON DEFICIENCY NEC 10/08/2006    Past Surgical History:  Procedure Laterality Date  . ABDOMINAL HYSTERECTOMY    . ANKLE SURGERY    . TUBAL LIGATION      OB History    No data available       Home Medications    Prior to Admission medications   Medication Sig Start Date End Date Taking? Authorizing Provider  cyclobenzaprine (FLEXERIL) 5 MG tablet Take 1 tablet (5 mg total) by mouth 3 (three) times daily as needed (muscle soreness). 07/22/15   Devoria AlbeKnapp, Iva, MD  fluticasone-salmeterol (ADVAIR HFA) [phone] MCG/ACT inhaler Inhale 2 puffs into the lungs 2 (two) times daily.    [provider]  loratadine (CLARITIN) 10 MG tablet Take 10 mg by mouth daily.    [provider]  naproxen (NAPROSYN) 500 MG tablet Take 1 po BID with food prn pain 07/22/15   Devoria AlbeKnapp, Iva, MD  naproxen (NAPROSYN) 500 MG tablet Take 1 tablet (500 mg total) by mouth 2 (two) times daily.  09/05/15   Anselm PancoastJoy, Shawn C, PA-C    Family History History reviewed. No pertinent family history.  Social History Social History   Tobacco Use  . Smoking status: Never Smoker  . Smokeless tobacco: Never Used  Substance Use Topics  . Alcohol use: No  . Drug use: Not on file     Allergies   Iodinated diagnostic agents   Review of Systems Review of Systems  Constitutional: Negative for chills and fever.  HENT: Positive for congestion, postnasal drip, rhinorrhea, sinus pressure, sinus pain, sneezing and sore throat. Negative for ear discharge, ear pain, trouble swallowing and voice change.   Eyes: Negative for discharge and visual disturbance.  Respiratory: Positive for cough. Negative for chest tightness, shortness of breath and wheezing.   Cardiovascular: Negative for chest pain.  Gastrointestinal: Negative for abdominal pain, nausea and vomiting.  Musculoskeletal: Negative for neck pain and neck stiffness.  Skin: Negative for rash.  Neurological: Positive for headaches.     Physical Exam Updated Vital Signs BP (!) 174/103   Pulse 87   Temp 98.5 F (36.9 C) (Oral)   Resp 16   Ht 5\' 11"  (1.803 m)   Wt 127 kg (280 lb)   SpO2 99%   BMI 39.05 kg/m   Physical Exam  Constitutional: She is oriented to person, place, and time. She appears well-developed and well-nourished. No distress.  HENT:  Head: Normocephalic and atraumatic.  TMs clear with good landmarks, moderate nasal mucosa edema with clear rhinorrhea, posterior oropharynx clear and moist, with some erythema, no edema or exudates  Eyes: EOM are normal. Pupils are equal, round, and reactive to light. Right eye exhibits no discharge. Left eye exhibits no discharge.  Neck: Normal range of motion. Neck supple.  Cardiovascular: Normal rate, regular rhythm and normal heart sounds.  Pulmonary/Chest: Effort normal and breath sounds normal. No stridor. No respiratory distress. She has no wheezes. She has no rales.    Abdominal: Soft. Bowel sounds are normal. She exhibits no distension. There is no tenderness. There is no guarding.  Lymphadenopathy:    She has no cervical adenopathy.  Neurological: She is alert and oriented to person, place, and time. No cranial nerve deficit. Coordination normal.  Skin: Skin is warm and dry. Capillary refill takes less than 2 seconds. She is not diaphoretic.  Psychiatric: She has a normal mood and affect. Her behavior is normal.  Nursing note and vitals reviewed.    ED Treatments / Results  Labs (all labs ordered are listed, but only abnormal results are displayed) Labs Reviewed - No data to display  EKG  EKG Interpretation None       Radiology No results found.  Procedures Procedures (including critical care time)  Medications Ordered in ED Medications - No data to display   Initial Impression / Assessment and Plan / ED Course  I have reviewed the triage vital signs and the nursing notes.  Pertinent labs & imaging results that were available during my care of the patient were reviewed by me and considered in my medical decision making (see chart for details).  Pt presents with nasal congestion and cough. Pt is well appearing and vitals are normal. Lungs CTA on exam. Patients symptoms are consistent with URI with acute sinusitis, likely viral etiology. Discussed that antibiotics are not indicated for viral infections. Pt will be discharged with symptomatic treatment, counseled pt on warm saline nasal flushes. Verbalizes understanding and is agreeable with plan. Pt is hemodynamically stable & in NAD prior to dc.   Final Clinical Impressions(s) / ED Diagnoses   Final diagnoses:  Acute non-recurrent maxillary sinusitis  Cough    ED Discharge Orders        Ordered    benzonatate (TESSALON) 100 MG capsule  Every 8 hours     12/03/16 1443       Legrand RamsFord, Doris Mcgilvery N, PA-C 12/03/16 2042    Abelino DerrickMackuen, Courteney Lyn, MD 12/05/16 1510

## 2016-12-03 NOTE — Discharge Instructions (Signed)
Your symptoms are likely caused by a virus, continue to support your symptoms with over-the-counter medications with decongestants, nasal saline sprays, you may also try a Nettie pot.  Tessalon Perles for cough.  Tylenol or ibuprofen for pain or fever.  If symptoms are not improving through the weekend please follow-up with your primary care doctor.  If you develop worsening symptoms, persistent fevers, headache neck stiffness or other concerning symptoms please return to the ED for sooner evaluation.

## 2016-12-03 NOTE — ED Notes (Signed)
ED Provider at bedside. 

## 2016-12-03 NOTE — ED Triage Notes (Signed)
Pt c/o URi symptoms x 5 days 

## 2017-06-05 ENCOUNTER — Other Ambulatory Visit: Payer: Self-pay | Admitting: Family Medicine

## 2017-06-05 DIAGNOSIS — Z1231 Encounter for screening mammogram for malignant neoplasm of breast: Secondary | ICD-10-CM

## 2017-06-26 ENCOUNTER — Ambulatory Visit
Admission: RE | Admit: 2017-06-26 | Discharge: 2017-06-26 | Disposition: A | Discharge status: Home / Self Care | Payer: BC Managed Care – PPO | Source: Ambulatory Visit | Attending: Family Medicine | Admitting: Family Medicine

## 2017-06-26 DIAGNOSIS — Z1231 Encounter for screening mammogram for malignant neoplasm of breast: Secondary | ICD-10-CM

## 2017-12-29 ENCOUNTER — Encounter (HOSPITAL_BASED_OUTPATIENT_CLINIC_OR_DEPARTMENT_OTHER): Payer: Self-pay

## 2017-12-29 ENCOUNTER — Emergency Department (HOSPITAL_BASED_OUTPATIENT_CLINIC_OR_DEPARTMENT_OTHER)
Admission: EM | Admit: 2017-12-29 | Discharge: 2017-12-29 | Disposition: A | Discharge status: Home / Self Care | Payer: BC Managed Care – PPO | Attending: Emergency Medicine | Admitting: Emergency Medicine

## 2017-12-29 ENCOUNTER — Other Ambulatory Visit: Payer: Self-pay

## 2017-12-29 ENCOUNTER — Emergency Department (HOSPITAL_BASED_OUTPATIENT_CLINIC_OR_DEPARTMENT_OTHER): Payer: BC Managed Care – PPO

## 2017-12-29 DIAGNOSIS — R05 Cough: Secondary | ICD-10-CM | POA: Diagnosis present

## 2017-12-29 DIAGNOSIS — Z79899 Other long term (current) drug therapy: Secondary | ICD-10-CM | POA: Insufficient documentation

## 2017-12-29 DIAGNOSIS — B9789 Other viral agents as the cause of diseases classified elsewhere: Secondary | ICD-10-CM

## 2017-12-29 DIAGNOSIS — J069 Acute upper respiratory infection, unspecified: Secondary | ICD-10-CM | POA: Insufficient documentation

## 2017-12-29 MED ORDER — BENZONATATE 100 MG PO CAPS
100.0000 mg | ORAL_CAPSULE | Freq: Once | ORAL | Status: AC
  Administered 2017-12-29: 100 mg via ORAL
  Filled 2017-12-29: qty 1

## 2017-12-29 MED ORDER — ACETAMINOPHEN 325 MG PO TABS
650.0000 mg | ORAL_TABLET | Freq: Once | ORAL | Status: AC
Start: 2017-12-29 — End: 2017-12-29
  Administered 2017-12-29: 650 mg via ORAL
  Filled 2017-12-29: qty 2

## 2017-12-29 NOTE — Discharge Instructions (Signed)
Your symptoms are likely caused by a viral upper respiratory infection. Antibiotics are not helpful in treating viral infection, the virus should run its course in about 5-7 days. Please make sure you are drinking plenty of fluids. You can treat your symptoms supportively with tylenol/ibuprofen for fevers and pains, Zyrtec and Flonase to help with nasal congestion, and over the counter cough syrups and throat lozenges to help with cough. If your symptoms are not improving please follow up with you Primary doctor.   If you develop persistent fevers, shortness of breath or difficulty breathing, chest pain, severe headache and neck pain, persistent nausea and vomiting or other new or concerning symptoms return to the Emergency department.  

## 2017-12-29 NOTE — ED Triage Notes (Signed)
C/o cough x 5 days-NAD-steady gait

## 2017-12-29 NOTE — ED Provider Notes (Signed)
[facility] EMERGENCY DEPARTMENT Provider Note   CSN: [id number] Arrival date & time: 12/29/17  1752     History   Chief Complaint Chief Complaint  Patient presents with  . Cough    HPI Wanda English is a 52 y.o. female.  Wanda English is a 52 y.o. Female otherwise healthy, presents to the emergency department for evaluation of 5 days of cough.  Patient reports cough is been persistent was initially productive but is now more of a dry hacking cough.  She reports associated rhinorrhea, nasal congestion and postnasal drip and thinks this is why she is likely coughing.  She is also had some mild sore throat.  She denies fevers or chills.  No headache or neck stiffness.  No chest pain or shortness of breath.  No abdominal pain, nausea or vomiting.  She has been using some over-the-counter medications with minimal improvement, reports she uses Flonase intermittently.  No known sick contacts.  Nothing seems to make symptoms better or worse.     History reviewed. No pertinent past medical history.  Patient Active Problem List   Diagnosis Date Noted  . 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 ANEMIA, IRON DEFICIENCY NEC 10/08/2006    Past Surgical History:  Procedure Laterality Date  . ABDOMINAL HYSTERECTOMY    . ANKLE SURGERY    . TUBAL LIGATION       OB History   None      Home Medications    Prior to Admission medications   Medication Sig Start Date End Date Taking? Authorizing Provider  benzonatate (TESSALON) 100 MG capsule Take 1 capsule (100 mg total) every 8 (eight) hours by mouth. 12/03/16   Dartha Lodge, PA-C  cyclobenzaprine (FLEXERIL) 5 MG tablet Take 1 tablet (5 mg total) by mouth 3 (three) times daily as needed (muscle soreness). 07/22/15   Devoria Albe, MD  fluticasone-salmeterol (ADVAIR HFA) [id number] MCG/ACT inhaler Inhale 2 puffs into the lungs 2 (two) times daily.    [provider]  loratadine (CLARITIN) 10 MG tablet Take 10 mg by mouth  daily.    [provider]  naproxen (NAPROSYN) 500 MG tablet Take 1 po BID with food prn pain 07/22/15   Devoria Albe, MD  naproxen (NAPROSYN) 500 MG tablet Take 1 tablet (500 mg total) by mouth 2 (two) times daily. 09/05/15   Anselm Pancoast, PA-C    Family History No family history on file.  Social History Social History   Tobacco Use  . Smoking status: Never Smoker  . Smokeless tobacco: Never Used  Substance Use Topics  . Alcohol use: No  . Drug use: Never     Allergies   Iodinated diagnostic agents   Review of Systems Review of Systems  Constitutional: Negative for chills and fever.  HENT: Positive for congestion, postnasal drip, rhinorrhea and sore throat.   Eyes: Negative for discharge, redness and itching.  Respiratory: Positive for cough. Negative for chest tightness, shortness of breath and wheezing.   Cardiovascular: Negative for chest pain.  Gastrointestinal: Negative for abdominal pain, nausea and vomiting.  Musculoskeletal: Negative for arthralgias and myalgias.  Skin: Negative for color change and rash.  Neurological: Positive for headaches. Negative for dizziness, syncope and light-headedness.     Physical Exam Updated Vital Signs BP (!) 164/97 (BP Location: Right Arm)   Pulse (!) 103   Temp 98.4 F (36.9 C) (Oral)   Resp 14   Ht 5\' 11"  (1.803 m)   Wt 133.8  kg   SpO2 100%   BMI 41.14 kg/m   Physical Exam  Constitutional: She appears well-developed and well-nourished. She does not appear ill. No distress.  HENT:  Head: Normocephalic and atraumatic.  Mouth/Throat: Oropharynx is clear and moist.  TMs clear with good landmarks, moderate nasal mucosa edema with clear rhinorrhea, posterior oropharynx clear and moist, with some erythema, no edema or exudates, uvula midline  Eyes: Right eye exhibits no discharge. Left eye exhibits no discharge.  Neck: Neck supple.  No rigidity  Cardiovascular: Normal rate, regular rhythm, normal heart sounds and  intact distal pulses.  Pulmonary/Chest: Effort normal and breath sounds normal. No respiratory distress.  Respirations equal and unlabored, patient able to speak in full sentences, lungs clear to auscultation bilaterally, occasional cough during exam  Abdominal: Soft. Bowel sounds are normal. She exhibits no distension and no mass. There is no tenderness. There is no guarding.  Abdomen soft, nondistended, nontender to palpation in all quadrants without guarding or peritoneal signs  Musculoskeletal: She exhibits no deformity.  Lymphadenopathy:    She has no cervical adenopathy.  Neurological: She is alert.  Skin: Skin is warm and dry. Capillary refill takes less than 2 seconds. She is not diaphoretic.  Nursing note and vitals reviewed.    ED Treatments / Results  Labs (all labs ordered are listed, but only abnormal results are displayed) Labs Reviewed - No data to display  EKG None  Radiology Dg Chest 2 View  Result Date: 12/29/2017 CLINICAL DATA:  Cough for the past 5 days, worsening. EXAM: CHEST - 2 VIEW COMPARISON:  07/22/2015. FINDINGS: Normal sized heart. Clear lungs. Minimal peribronchial thickening without significant change. Unremarkable bones. IMPRESSION: Stable minimal bronchitic changes. Electronically Signed   By: Beckie SaltsSteven  Reid M.D.   On: 12/29/2017 19:19    Procedures Procedures (including critical care time)  Medications Ordered in ED Medications  benzonatate (TESSALON) capsule 100 mg (100 mg Oral Given 12/29/17 1900)  acetaminophen (TYLENOL) tablet 650 mg (650 mg Oral Given 12/29/17 1900)     Initial Impression / Assessment and Plan / ED Course  I have reviewed the triage vital signs and the nursing notes.  Pertinent labs & imaging results that were available during my care of the patient were reviewed by me and considered in my medical decision making (see chart for details).  Pt presents with nasal congestion and cough. Pt is well appearing and vitals are  normal. Lungs CTA on exam. Pt CXR negative for acute infiltrate. Patients symptoms are consistent with URI, likely viral etiology. Discussed that antibiotics are not indicated for viral infections. Pt will be discharged with symptomatic treatment.  Verbalizes understanding and is agreeable with plan. Pt is hemodynamically stable & in NAD prior to dc.  Vitals:   12/29/17 1756 12/29/17 1757 12/29/17 1939  BP: (!) 164/97  127/70  Pulse: (!) 103  96  Resp: 14  18  Temp: 98.4 F (36.9 C)    TempSrc: Oral    SpO2: 100%  100%  Weight:  133.8 kg   Height:  5\' 11"  (1.803 m)      Final Clinical Impressions(s) / ED Diagnoses   Final diagnoses:  Viral URI with cough    ED Discharge Orders    None       Legrand RamsFord, Ozzie Remmers N, PA-C 12/29/17 Marcella Dubs1958    Plunkett, Whitney, MD 01/02/18 2053

## 2017-12-29 NOTE — ED Notes (Signed)
Patient transported to X-ray 

## 2019-04-28 ENCOUNTER — Other Ambulatory Visit: Payer: Self-pay | Admitting: Family Medicine

## 2019-04-28 DIAGNOSIS — Z1231 Encounter for screening mammogram for malignant neoplasm of breast: Secondary | ICD-10-CM

## 2019-04-30 ENCOUNTER — Other Ambulatory Visit: Payer: Self-pay

## 2019-04-30 ENCOUNTER — Ambulatory Visit
Admission: RE | Admit: 2019-04-30 | Discharge: 2019-04-30 | Disposition: A | Discharge status: Home / Self Care | Payer: BC Managed Care – PPO | Source: Ambulatory Visit | Attending: Family Medicine | Admitting: Family Medicine

## 2019-04-30 DIAGNOSIS — Z1231 Encounter for screening mammogram for malignant neoplasm of breast: Secondary | ICD-10-CM

## 2020-03-30 ENCOUNTER — Other Ambulatory Visit: Payer: Self-pay | Admitting: Family Medicine

## 2020-03-30 DIAGNOSIS — Z1231 Encounter for screening mammogram for malignant neoplasm of breast: Secondary | ICD-10-CM

## 2020-05-24 ENCOUNTER — Ambulatory Visit
Admission: RE | Admit: 2020-05-24 | Discharge: 2020-05-24 | Disposition: A | Discharge status: Home / Self Care | Payer: BC Managed Care – PPO | Source: Ambulatory Visit | Attending: Family Medicine | Admitting: Family Medicine

## 2020-05-24 ENCOUNTER — Other Ambulatory Visit: Payer: Self-pay

## 2020-05-24 DIAGNOSIS — Z1231 Encounter for screening mammogram for malignant neoplasm of breast: Secondary | ICD-10-CM

## 2020-08-21 NOTE — Progress Notes (Addendum)
COVID Vaccine Completed: No Date COVID Vaccine completed: Has received booster: COVID vaccine manufacturer: Pfizer    [employer]   Date of COVID positive in last 90 days:  No  PCP - [name], MD.   Office note on chart Cardiologist - N/A  Chest x-ray - N/A EKG - 08-22-20 Epic Stress Test - N/A ECHO - N/A Cardiac Cath - N/A Pacemaker/ICD device last checked: Spinal Cord Stimulator:  Sleep Study - N/A CPAP -   Fasting Blood Sugar - N/A Checks Blood Sugar _____ times a day  Blood Thinner Instructions:  N/A Aspirin Instructions: Last Dose:  Activity level:  Can go up a flight of stairs and perform activities of daily living without stopping and without symptoms of chest pain or shortness of breath.      Anesthesia review:  BP elevated at PAT 170/98 and 168/90 on recheck.  Stop Bang assessment 5.  Patient denies shortness of breath, fever, cough and chest pain at PAT appointment   Patient verbalized understanding of instructions that were given to them at the PAT appointment. Patient was also instructed that they will need to review over the PAT instructions again at home before surgery.

## 2020-08-21 NOTE — Patient Instructions (Addendum)
DUE TO COVID-19 ONLY ONE VISITOR IS ALLOWED TO COME WITH YOU AND STAY IN THE WAITING ROOM ONLY DURING PRE OP AND PROCEDURE.   **NO VISITORS ARE ALLOWED IN THE SHORT STAY AREA OR RECOVERY ROOM!!**         Your procedure is scheduled on:  Thursday, 08-31-20   Report to [facility] Main  Entrance   Report to admitting at 10:30 AM   Call this number if you have problems the morning of surgery [phone]   Do not eat food :After Midnight.   May have liquids until 9:30 AM day of surgery  CLEAR LIQUID DIET  Foods Allowed                                                                     Foods Excluded  Water, Black Coffee and tea, regular and decaf               liquids that you cannot  Plain Jell-O in any flavor  (No red)                                     see through such as: Fruit ices (not with fruit pulp)                                      milk, soups, orange juice              Iced Popsicles (No red)                                      All solid food                                   Apple juices Sports drinks like Gatorade (No red) Lightly seasoned clear broth or consume(fat free) Sugar, honey syrup      Complete one Ensure drink the morning of surgery at  9:30 AM  the day of surgery.       The day of surgery:  Drink ONE (1) Pre-Surgery Clear Ensure the morning of surgery. Drink in one sitting. Do not sip.  This drink was given to you during your hospital  pre-op appointment visit. Nothing else to drink after completing the Pre-Surgery Clear Ensure.          If you have questions, please contact your surgeon's office.     Oral Hygiene is also important to reduce your risk of infection.                                    Remember - BRUSH YOUR TEETH THE MORNING OF SURGERY WITH YOUR REGULAR TOOTHPASTE   Do NOT smoke after Midnight  Take these medicines the morning of surgery with A SIP OF WATER:  Amlodipine, Loratadine, Pantoprazole.  Okay to use  inhalers/nebulizers  You may not have any metal on your body including hair pins, jewelry, and body piercing             Do not wear make-up, lotions, powders, perfumes/cologne, or deodorant  Do not wear nail polish including gel and S&S, artificial/acrylic nails, or any other type of covering on natural nails including finger and toenails. If you have artificial nails, gel coating, etc. that needs to be removed by a nail salon please have this removed prior to surgery or surgery may need to be canceled/ delayed if the surgeon/ anesthesia feels like they are unable to be safely monitored.   Do not shave  48 hours prior to surgery.             Do not bring valuables to the hospital. [facility] IS NOT   RESPONSIBLE   FOR VALUABLES.   Contacts, dentures or bridgework may not be worn into surgery.    Patients discharged the day of surgery will not be allowed to drive home.    Please read over the following fact sheets you were given: IF YOU HAVE QUESTIONS ABOUT YOUR PRE OP INSTRUCTIONS PLEASE CALL [phone] [facility]- Preparing for Total Shoulder Arthroplasty    Before surgery, you can play an important role. Because skin is not sterile, your skin needs to be as free of germs as possible. You can reduce the number of germs on your skin by using the following products. Benzoyl Peroxide Gel Reduces the number of germs present on the skin Applied twice a day to shoulder area starting two days before surgery    ==================================================================  Please follow these instructions carefully:  BENZOYL PEROXIDE 5% GEL  Please do not use if you have an allergy to benzoyl peroxide.   If your skin becomes reddened/irritated stop using the benzoyl peroxide.  Starting two days before surgery, apply as follows: Apply benzoyl peroxide in the morning and at night. Apply after taking a shower. If you are not taking a shower clean  entire shoulder front, back, and side along with the armpit with a clean wet washcloth.  Place a quarter-sized dollop on your shoulder and rub in thoroughly, making sure to cover the front, back, and side of your shoulder, along with the armpit.   2 days before ____ AM   ____ PM              1 day before ____ AM   ____ PM                         Do this twice a day for two days.  (Last application is the night before surgery, AFTER using the CHG soap as described below).  Do NOT apply benzoyl peroxide gel on the day of surgery.     [place] - Preparing for Surgery Before surgery, you can play an important role.  Because skin is not sterile, your skin needs to be as free of germs as possible.  You can reduce the number of germs on your skin by washing with CHG (chlorahexidine gluconate) soap before surgery.  CHG is an antiseptic cleaner which kills germs and bonds with the skin to continue killing germs even after washing. Please DO NOT use if you have an allergy to CHG or antibacterial soaps.  If your skin becomes reddened/irritated stop using the CHG and inform your nurse when you arrive at Short Stay. Do not shave (including legs and  underarms) for at least 48 hours prior to the first CHG shower.  You may shave your face/neck.  Please follow these instructions carefully:  1.  Shower with CHG Soap the night before surgery and the  morning of surgery.  2.  If you choose to wash your hair, wash your hair first as usual with your normal  shampoo.  3.  After you shampoo, rinse your hair and body thoroughly to remove the shampoo.                             4.  Use CHG as you would any other liquid soap.  You can apply chg directly to the skin and wash.  Gently with a scrungie or clean washcloth.  5.  Apply the CHG Soap to your body ONLY FROM THE NECK DOWN.   Do   not use on face/ open                           Wound or open sores. Avoid contact with eyes, ears mouth and   genitals (private  parts).                       Wash face,  Genitals (private parts) with your normal soap.             6.  Wash thoroughly, paying special attention to the area where your    surgery  will be performed.  7.  Thoroughly rinse your body with warm water from the neck down.  8.  DO NOT shower/wash with your normal soap after using and rinsing off the CHG Soap.                9.  Pat yourself dry with a clean towel.            10.  Wear clean pajamas.            11.  Place clean sheets on your bed the night of your first shower and do not  sleep with pets. Day of Surgery : Do not apply any lotions/deodorants the morning of surgery.  Please wear clean clothes to the hospital/surgery center.  FAILURE TO FOLLOW THESE INSTRUCTIONS MAY RESULT IN THE CANCELLATION OF YOUR SURGERY  PATIENT SIGNATURE_________________________________  NURSE SIGNATURE__________________________________  ________________________________________________________________________   Wanda English  An incentive spirometer is a tool that can help keep your lungs clear and active. This tool measures how well you are filling your lungs with each breath. Taking long deep breaths may help reverse or decrease the chance of developing breathing (pulmonary) problems (especially infection) following: A long period of time when you are unable to move or be active. BEFORE THE PROCEDURE  If the spirometer includes an indicator to show your best effort, your nurse or respiratory therapist will set it to a desired goal. If possible, sit up straight or lean slightly forward. Try not to slouch. Hold the incentive spirometer in an upright position. INSTRUCTIONS FOR USE  Sit on the edge of your bed if possible, or sit up as far as you can in bed or on a chair. Hold the incentive spirometer in an upright position. Breathe out normally. Place the mouthpiece in your mouth and seal your lips tightly around it. Breathe in slowly and as  deeply as possible, raising the piston or the ball toward the top of  the column. Hold your breath for 3-5 seconds or for as long as possible. Allow the piston or ball to fall to the bottom of the column. Remove the mouthpiece from your mouth and breathe out normally. Rest for a few seconds and repeat Steps 1 through 7 at least 10 times every 1-2 hours when you are awake. Take your time and take a few normal breaths between deep breaths. The spirometer may include an indicator to show your best effort. Use the indicator as a goal to work toward during each repetition. After each set of 10 deep breaths, practice coughing to be sure your lungs are clear. If you have an incision (the cut made at the time of surgery), support your incision when coughing by placing a pillow or rolled up towels firmly against it. Once you are able to get out of bed, walk around indoors and cough well. You may stop using the incentive spirometer when instructed by your caregiver.  RISKS AND COMPLICATIONS Take your time so you do not get dizzy or light-headed. If you are in pain, you may need to take or ask for pain medication before doing incentive spirometry. It is harder to take a deep breath if you are having pain. AFTER USE Rest and breathe slowly and easily. It can be helpful to keep track of a log of your progress. Your caregiver can provide you with a simple table to help with this. If you are using the spirometer at home, follow these instructions: SEEK MEDICAL CARE IF:  You are having difficultly using the spirometer. You have trouble using the spirometer as often as instructed. Your pain medication is not giving enough relief while using the spirometer. You develop fever of 100.5 F (38.1 C) or higher. SEEK IMMEDIATE MEDICAL CARE IF:  You cough up bloody sputum that had not been present before. You develop fever of 102 F (38.9 C) or greater. You develop worsening pain at or near the incision site. MAKE  SURE YOU:  Understand these instructions. Will watch your condition. Will get help right away if you are not doing well or get worse. Document Released: 05/20/2006 Document Revised: 04/01/2011 Document Reviewed: 07/21/2006 [facility] Patient Information 2014 [employer], [place].   ________________________________________________________________________

## 2020-08-22 ENCOUNTER — Encounter (HOSPITAL_COMMUNITY): Payer: Self-pay

## 2020-08-22 ENCOUNTER — Other Ambulatory Visit: Payer: Self-pay

## 2020-08-22 ENCOUNTER — Encounter (HOSPITAL_COMMUNITY)
Admission: RE | Admit: 2020-08-22 | Discharge: 2020-08-22 | Disposition: A | Discharge status: Home / Self Care | Payer: BC Managed Care – PPO | Source: Ambulatory Visit | Attending: Orthopedic Surgery | Admitting: Orthopedic Surgery

## 2020-08-22 DIAGNOSIS — Z01818 Encounter for other preprocedural examination: Secondary | ICD-10-CM | POA: Insufficient documentation

## 2020-08-22 HISTORY — DX: Gastro-esophageal reflux disease without esophagitis: K21.9

## 2020-08-22 HISTORY — DX: Unspecified osteoarthritis, unspecified site: M19.90

## 2020-08-22 HISTORY — DX: Essential (primary) hypertension: I10

## 2020-08-22 HISTORY — DX: Anemia, unspecified: D64.9

## 2020-08-22 LAB — CBC
HCT: 40.4 % (ref 36.0–46.0)
Hemoglobin: 12.4 g/dL (ref 12.0–15.0)
MCH: 26.7 pg (ref 26.0–34.0)
MCHC: 30.7 g/dL (ref 30.0–36.0)
MCV: 86.9 fL (ref 80.0–100.0)
Platelets: 298 10*3/uL (ref 150–400)
RBC: 4.65 MIL/uL (ref 3.87–5.11)
RDW: 15.8 % — ABNORMAL HIGH (ref 11.5–15.5)
WBC: 10 10*3/uL (ref 4.0–10.5)
nRBC: 0 % (ref 0.0–0.2)

## 2020-08-22 LAB — BASIC METABOLIC PANEL
Anion gap: 6 (ref 5–15)
BUN: 14 mg/dL (ref 6–20)
CO2: 30 mmol/L (ref 22–32)
Calcium: 9.2 mg/dL (ref 8.9–10.3)
Chloride: 103 mmol/L (ref 98–111)
Creatinine, Ser: 0.98 mg/dL (ref 0.44–1.00)
GFR, Estimated: >60 mL/min (ref >60)
Glucose, Bld: 114 mg/dL — ABNORMAL HIGH (ref 70–99)
Potassium: 4.3 mmol/L (ref 3.5–5.1)
Sodium: 139 mmol/L (ref 135–145)

## 2020-08-22 NOTE — Progress Notes (Signed)
   08/22/20 1041  OBSTRUCTIVE SLEEP APNEA  Have you ever been diagnosed with sleep apnea through a sleep study? No  Do you snore loudly (loud enough to be heard through closed doors)?  0  Do you often feel tired, fatigued, or sleepy during the daytime (such as falling asleep during driving or talking to someone)? 1  Has anyone observed you stop breathing during your sleep? 0  Do you have, or are you being treated for high blood pressure? 1  BMI more than 35 kg/m2? 1  Age > 50 (1-yes) 1  Neck circumference greater than:Female 16 inches or larger, Female 17inches or larger? 1  Female Gender (Yes=1) 0  Obstructive Sleep Apnea Score 5  Score 5 or greater  Results sent to PCP

## 2020-08-31 ENCOUNTER — Ambulatory Visit (HOSPITAL_COMMUNITY): Payer: BC Managed Care – PPO | Admitting: Physician Assistant

## 2020-08-31 ENCOUNTER — Ambulatory Visit (HOSPITAL_COMMUNITY)
Admission: RE | Admit: 2020-08-31 | Discharge: 2020-08-31 | Disposition: A | Discharge status: Home / Self Care | Payer: BC Managed Care – PPO | Source: Ambulatory Visit | Attending: Orthopedic Surgery | Admitting: Orthopedic Surgery

## 2020-08-31 ENCOUNTER — Encounter (HOSPITAL_COMMUNITY): Payer: Self-pay | Admitting: Orthopedic Surgery

## 2020-08-31 ENCOUNTER — Encounter (HOSPITAL_COMMUNITY)
Admission: RE | Disposition: A | Discharge status: Home / Self Care | Payer: Self-pay | Source: Ambulatory Visit | Attending: Orthopedic Surgery

## 2020-08-31 ENCOUNTER — Ambulatory Visit (HOSPITAL_COMMUNITY): Payer: BC Managed Care – PPO | Admitting: Certified Registered Nurse Anesthetist

## 2020-08-31 ENCOUNTER — Other Ambulatory Visit: Payer: Self-pay

## 2020-08-31 DIAGNOSIS — M75101 Unspecified rotator cuff tear or rupture of right shoulder, not specified as traumatic: Secondary | ICD-10-CM

## 2020-08-31 DIAGNOSIS — Z79899 Other long term (current) drug therapy: Secondary | ICD-10-CM | POA: Insufficient documentation

## 2020-08-31 DIAGNOSIS — X58XXXA Exposure to other specified factors, initial encounter: Secondary | ICD-10-CM | POA: Insufficient documentation

## 2020-08-31 DIAGNOSIS — M7541 Impingement syndrome of right shoulder: Secondary | ICD-10-CM | POA: Diagnosis not present

## 2020-08-31 DIAGNOSIS — S43431A Superior glenoid labrum lesion of right shoulder, initial encounter: Secondary | ICD-10-CM | POA: Diagnosis not present

## 2020-08-31 DIAGNOSIS — M75121 Complete rotator cuff tear or rupture of right shoulder, not specified as traumatic: Secondary | ICD-10-CM | POA: Insufficient documentation

## 2020-08-31 DIAGNOSIS — M19011 Primary osteoarthritis, right shoulder: Secondary | ICD-10-CM | POA: Insufficient documentation

## 2020-08-31 HISTORY — PX: SHOULDER ARTHROSCOPY WITH ROTATOR CUFF REPAIR: SHX5685

## 2020-08-31 SURGERY — ARTHROSCOPY, SHOULDER, WITH ROTATOR CUFF REPAIR
Anesthesia: General | Site: Shoulder | Laterality: Right

## 2020-08-31 MED ORDER — HYDROMORPHONE HCL 1 MG/ML IJ SOLN
INTRAMUSCULAR | Status: AC
  Filled 2020-08-31: qty 1

## 2020-08-31 MED ORDER — CHLORHEXIDINE GLUCONATE 0.12 % MT SOLN
15.0000 mL | Freq: Once | OROMUCOSAL | Status: AC
  Administered 2020-08-31: 15 mL via OROMUCOSAL

## 2020-08-31 MED ORDER — PHENYLEPHRINE 40 MCG/ML (10ML) SYRINGE FOR IV PUSH (FOR BLOOD PRESSURE SUPPORT)
PREFILLED_SYRINGE | INTRAVENOUS | Status: AC
  Filled 2020-08-31: qty 10

## 2020-08-31 MED ORDER — PROPOFOL 10 MG/ML IV BOLUS
INTRAVENOUS | Status: DC | PRN
  Administered 2020-08-31: 200 mg via INTRAVENOUS

## 2020-08-31 MED ORDER — OXYCODONE HCL 5 MG PO TABS
5.0000 mg | ORAL_TABLET | Freq: Once | ORAL | Status: DC | PRN
Start: 2020-08-31 — End: 2020-08-31

## 2020-08-31 MED ORDER — FENTANYL CITRATE (PF) 100 MCG/2ML IJ SOLN
INTRAMUSCULAR | Status: AC
  Filled 2020-08-31: qty 2

## 2020-08-31 MED ORDER — CYCLOBENZAPRINE HCL 10 MG PO TABS: 10.0000 mg | ORAL_TABLET | Freq: Three times a day (TID) | ORAL | 1 refills | Status: AC | PRN

## 2020-08-31 MED ORDER — KETOROLAC TROMETHAMINE 30 MG/ML IJ SOLN
INTRAMUSCULAR | Status: AC
  Filled 2020-08-31: qty 1

## 2020-08-31 MED ORDER — SUGAMMADEX SODIUM 200 MG/2ML IV SOLN
INTRAVENOUS | Status: DC | PRN
  Administered 2020-08-31: 200 mg via INTRAVENOUS

## 2020-08-31 MED ORDER — MIDAZOLAM HCL 2 MG/2ML IJ SOLN
1.0000 mg | INTRAMUSCULAR | Status: DC
  Administered 2020-08-31: 2 mg via INTRAVENOUS
  Filled 2020-08-31: qty 2

## 2020-08-31 MED ORDER — ONDANSETRON HCL 4 MG/2ML IJ SOLN
INTRAMUSCULAR | Status: DC | PRN
  Administered 2020-08-31: 4 mg via INTRAVENOUS

## 2020-08-31 MED ORDER — LIDOCAINE 2% (20 MG/ML) 5 ML SYRINGE
INTRAMUSCULAR | Status: AC
  Filled 2020-08-31: qty 5

## 2020-08-31 MED ORDER — SODIUM CHLORIDE 0.9 % IR SOLN
Status: DC | PRN
  Administered 2020-08-31: 12000 mL

## 2020-08-31 MED ORDER — ONDANSETRON HCL 4 MG PO TABS: 4.0000 mg | ORAL_TABLET | Freq: Three times a day (TID) | ORAL | 0 refills | Status: AC | PRN

## 2020-08-31 MED ORDER — ONDANSETRON HCL 4 MG/2ML IJ SOLN
INTRAMUSCULAR | Status: AC
  Filled 2020-08-31: qty 2

## 2020-08-31 MED ORDER — OXYCODONE HCL 5 MG/5ML PO SOLN
5.0000 mg | Freq: Once | ORAL | Status: DC | PRN
Start: 2020-08-31 — End: 2020-08-31

## 2020-08-31 MED ORDER — PROMETHAZINE HCL 25 MG/ML IJ SOLN
INTRAMUSCULAR | Status: AC
  Filled 2020-08-31: qty 1

## 2020-08-31 MED ORDER — DEXAMETHASONE SODIUM PHOSPHATE 10 MG/ML IJ SOLN
INTRAMUSCULAR | Status: DC | PRN
  Administered 2020-08-31: 10 mg via INTRAVENOUS

## 2020-08-31 MED ORDER — FENTANYL CITRATE (PF) 100 MCG/2ML IJ SOLN
50.0000 ug | INTRAMUSCULAR | Status: DC
  Administered 2020-08-31: 100 ug via INTRAVENOUS
  Filled 2020-08-31: qty 2

## 2020-08-31 MED ORDER — PROMETHAZINE HCL 25 MG/ML IJ SOLN
6.2500 mg | INTRAMUSCULAR | Status: DC | PRN
  Administered 2020-08-31: 6.25 mg via INTRAVENOUS

## 2020-08-31 MED ORDER — OXYCODONE-ACETAMINOPHEN 5-325 MG PO TABS: 1.0000 | ORAL_TABLET | ORAL | 0 refills | Status: AC | PRN

## 2020-08-31 MED ORDER — PHENYLEPHRINE 40 MCG/ML (10ML) SYRINGE FOR IV PUSH (FOR BLOOD PRESSURE SUPPORT): PREFILLED_SYRINGE | INTRAVENOUS | Status: DC | PRN

## 2020-08-31 MED ORDER — LIDOCAINE HCL (CARDIAC) PF 100 MG/5ML IV SOSY
PREFILLED_SYRINGE | INTRAVENOUS | Status: DC | PRN
Start: 2020-08-31 — End: 2020-08-31
  Administered 2020-08-31: 100 mg via INTRAVENOUS

## 2020-08-31 MED ORDER — MIDAZOLAM HCL 2 MG/2ML IJ SOLN
INTRAMUSCULAR | Status: AC
  Filled 2020-08-31: qty 2

## 2020-08-31 MED ORDER — BUPIVACAINE LIPOSOME 1.3 % IJ SUSP
INTRAMUSCULAR | Status: DC | PRN
  Administered 2020-08-31: 10 mL via PERINEURAL

## 2020-08-31 MED ORDER — ROCURONIUM BROMIDE 100 MG/10ML IV SOLN
INTRAVENOUS | Status: DC | PRN
  Administered 2020-08-31: 80 mg via INTRAVENOUS

## 2020-08-31 MED ORDER — NAPROXEN 500 MG PO TABS: 500.0000 mg | ORAL_TABLET | Freq: Two times a day (BID) | ORAL | 1 refills | Status: AC

## 2020-08-31 MED ORDER — DEXAMETHASONE SODIUM PHOSPHATE 10 MG/ML IJ SOLN
INTRAMUSCULAR | Status: AC
  Filled 2020-08-31: qty 1

## 2020-08-31 MED ORDER — LACTATED RINGERS IV SOLN: INTRAVENOUS | Status: DC

## 2020-08-31 MED ORDER — HYDRALAZINE HCL 20 MG/ML IJ SOLN
INTRAMUSCULAR | Status: DC | PRN
  Administered 2020-08-31: 10 mg via INTRAVENOUS

## 2020-08-31 MED ORDER — HYDROMORPHONE HCL 1 MG/ML IJ SOLN
0.2500 mg | INTRAMUSCULAR | Status: DC | PRN
  Administered 2020-08-31: 0.25 mg via INTRAVENOUS
  Administered 2020-08-31 (×2): 0.5 mg via INTRAVENOUS
  Administered 2020-08-31: 0.25 mg via INTRAVENOUS

## 2020-08-31 MED ORDER — ORAL CARE MOUTH RINSE: 15.0000 mL | Freq: Once | OROMUCOSAL | Status: AC

## 2020-08-31 MED ORDER — PROPOFOL 10 MG/ML IV BOLUS
INTRAVENOUS | Status: AC
  Filled 2020-08-31: qty 20

## 2020-08-31 MED ORDER — BUPIVACAINE HCL (PF) 0.5 % IJ SOLN
INTRAMUSCULAR | Status: DC | PRN
  Administered 2020-08-31: 10 mL via PERINEURAL
  Administered 2020-08-31: 15 mL via PERINEURAL

## 2020-08-31 MED ORDER — CEFAZOLIN IN SODIUM CHLORIDE 3-0.9 GM/100ML-% IV SOLN
3.0000 g | INTRAVENOUS | Status: AC
  Administered 2020-08-31: 3 g via INTRAVENOUS
  Filled 2020-08-31: qty 100

## 2020-08-31 MED ORDER — KETOROLAC TROMETHAMINE 30 MG/ML IJ SOLN
30.0000 mg | Freq: Once | INTRAMUSCULAR | Status: AC | PRN
  Administered 2020-08-31: 30 mg via INTRAVENOUS

## 2020-08-31 SURGICAL SUPPLY — 58 items
ANCH SUT SWLK 19.1X4.75 VT (Anchor) ×3 IMPLANT
ANCHOR PEEK 4.75X19.1 SWLK C (Anchor) ×3 IMPLANT
BAG COUNTER SPONGE SURGICOUNT (BAG) ×2 IMPLANT
BAG SPNG CNTER NS LX DISP (BAG) ×1
BLADE EXCALIBUR 4.0X13 (MISCELLANEOUS) ×2 IMPLANT
BOOTIES KNEE HIGH SLOAN (MISCELLANEOUS) ×4 IMPLANT
BURR OVAL 8 FLU 4.0X13 (MISCELLANEOUS) ×2 IMPLANT
BURR OVAL 8 FLU 5.0X13 (MISCELLANEOUS) ×2 IMPLANT
CANNULA ACUFLEX KIT 5X76 (CANNULA) ×2 IMPLANT
CANNULA DRILOCK 5.0X75 (CANNULA) ×1 IMPLANT
CANNULA TWIST IN 8.25X7CM (CANNULA) ×1 IMPLANT
CONNECTOR 5 IN 1 STRAIGHT STRL (MISCELLANEOUS) ×2 IMPLANT
COOLER ICEMAN CLASSIC (MISCELLANEOUS) IMPLANT
DISSECTOR  3.8MM X 13CM (MISCELLANEOUS) ×2
DISSECTOR 3.8MM X 13CM (MISCELLANEOUS) ×1 IMPLANT
DRAPE INCISE 23X17 IOBAN STRL (DRAPES) ×1
DRAPE INCISE 23X17 STRL (DRAPES) ×1 IMPLANT
DRAPE INCISE IOBAN 23X17 STRL (DRAPES) ×1 IMPLANT
DRAPE INCISE IOBAN 66X45 STRL (DRAPES) ×2 IMPLANT
DRAPE ORTHO SPLIT 77X108 STRL (DRAPES)
DRAPE STERI 35X30 U-POUCH (DRAPES) ×2 IMPLANT
DRAPE SURG 17X11 SM STRL (DRAPES) ×2 IMPLANT
DRAPE SURG ORHT 6 SPLT 77X108 (DRAPES) IMPLANT
DRAPE U-SHAPE 47X51 STRL (DRAPES) ×2 IMPLANT
DRSG PAD ABDOMINAL 8X10 ST (GAUZE/BANDAGES/DRESSINGS) ×4 IMPLANT
DURAPREP 26ML APPLICATOR (WOUND CARE) ×1 IMPLANT
GAUZE SPONGE 4X4 12PLY STRL (GAUZE/BANDAGES/DRESSINGS) ×2 IMPLANT
GLOVE SURG ENC MOIS LTX SZ7.5 (GLOVE) ×2 IMPLANT
GLOVE SURG ENC MOIS LTX SZ8 (GLOVE) ×2 IMPLANT
GLOVE SURG MICRO LTX SZ7 (GLOVE) ×4 IMPLANT
GLOVE SURG MICRO LTX SZ7.5 (GLOVE) ×2 IMPLANT
GOWN STRL REUS W/TWL LRG LVL3 (GOWN DISPOSABLE) ×4 IMPLANT
KIT BASIN OR (CUSTOM PROCEDURE TRAY) ×2 IMPLANT
KIT SHOULDER TRACTION (DRAPES) ×2 IMPLANT
KIT TURNOVER KIT A (KITS) ×2 IMPLANT
MANIFOLD NEPTUNE II (INSTRUMENTS) ×2 IMPLANT
NDL SCORPION MULTI FIRE (NEEDLE) IMPLANT
NEEDLE SCORPION MULTI FIRE (NEEDLE) ×2 IMPLANT
NS IRRIG 1000ML POUR BTL (IV SOLUTION) ×2 IMPLANT
PACK ARTHROSCOPY WL (CUSTOM PROCEDURE TRAY) ×2 IMPLANT
PAD ARMBOARD 7.5X6 YLW CONV (MISCELLANEOUS) ×2 IMPLANT
PAD COLD SHLDR WRAP-ON (PAD) IMPLANT
PROBE APOLLO 90XL (SURGICAL WAND) ×2 IMPLANT
SLING ARM FOAM STRAP MED (SOFTGOODS) IMPLANT
SLING ULTRA II L (ORTHOPEDIC SUPPLIES) ×1 IMPLANT
SPONGE T-LAP 4X18 ~~LOC~~+RFID (SPONGE) ×2 IMPLANT
STRIP CLOSURE SKIN 1/2X4 (GAUZE/BANDAGES/DRESSINGS) ×2 IMPLANT
SUT FIBERWIRE #2 38 T-5 BLUE (SUTURE)
SUT MNCRL AB 3-0 PS2 18 (SUTURE) ×2 IMPLANT
SUT PDS AB 0 CT 36 (SUTURE) IMPLANT
SUT TIGER TAPE 7 IN WHITE (SUTURE) ×3 IMPLANT
SUTURE FIBERWR #2 38 T-5 BLUE (SUTURE) IMPLANT
TAPE FIBER 2MM 7IN #2 BLUE (SUTURE) ×1 IMPLANT
TAPE PAPER 3X10 WHT MICROPORE (GAUZE/BANDAGES/DRESSINGS) ×2 IMPLANT
TOWEL OR 17X26 10 PK STRL BLUE (TOWEL DISPOSABLE) ×2 IMPLANT
TUBING ARTHROSCOPY IRRIG 16FT (MISCELLANEOUS) ×2 IMPLANT
WATER STERILE IRR 1000ML POUR (IV SOLUTION) ×1 IMPLANT
YANKAUER SUCT BULB TIP 10FT TU (MISCELLANEOUS) ×2 IMPLANT

## 2020-08-31 NOTE — Anesthesia Procedure Notes (Signed)
Anesthesia Regional Block: Interscalene brachial plexus block   Pre-Anesthetic Checklist: , timeout performed,  Correct Patient, Correct Site, Correct Laterality,  Correct Procedure, Correct Position, site marked,  Risks and benefits discussed,  Surgical consent,  Pre-op evaluation,  At surgeon's request and post-op pain management  Laterality: Right  Prep: chloraprep       Needles:  Injection technique: Single-shot  Needle Type: Echogenic Stimulator Needle     Needle Length: 9cm      Additional Needles:   Procedures:,,,, ultrasound used (permanent image in chart),,     Nerve Stimulator or Paresthesia:  Response: 0.56 mA  Additional Responses:   Narrative:  Start time: 08/31/2020 11:33 AM End time: 08/31/2020 11:41 AM Injection made incrementally with aspirations every 5 mL.  Performed by: Personally  Anesthesiologist: Eilene Ghazi, MD  Additional Notes: Patient tolerated the procedure well without complications

## 2020-08-31 NOTE — Transfer of Care (Signed)
Immediate Anesthesia Transfer of Care Note  Patient: Wanda English  Procedure(s) Performed: Right shoulder arthroscopy, subacromial decompression, distal clavicle resection, rotator cuff repair and labral debridement (Right: Shoulder)  Patient Location: PACU  Anesthesia Type:General and Regional  Level of Consciousness: sedated  Airway & Oxygen Therapy: Patient Spontanous Breathing and Patient connected to face mask oxygen  Post-op Assessment: Report given to RN and Post -op Vital signs reviewed and stable  Post vital signs: Reviewed and stable  Last Vitals:  Vitals Value Taken Time  BP 158/87 08/31/20 1425  Temp    Pulse 96 08/31/20 1428  Resp 13 08/31/20 1428  SpO2 99 % 08/31/20 1428  Vitals shown include unvalidated device data.  Last Pain:  Vitals:   08/31/20 1021  TempSrc:   PainSc: 8       Patients Stated Pain Goal: 3 (08/31/20 1021)  Complications: No notable events documented.

## 2020-08-31 NOTE — Discharge Instructions (Signed)
   Vania Rea. Supple, M.D., F.A.A.O.S. Orthopaedic Surgery Specializing in Arthroscopic and Reconstructive Surgery of the Shoulder [phone] [address] [place], [place] [address] - Fax [phone]  POST-OP SHOULDER ARTHROSCOPIC ROTATOR CUFF REPAIR INSTRUCTIONS  1. Call the office at [phone] to schedule your first post-op appointment 7-10 days from the date of your surgery.  2. Leave the steri-strips in place over your incisions when performing dressing changes and showering. You may remove your dressings and begin showering 72 hours from surgery. You can expect drainage that is clear to bloody in nature that occasionally will soak through your dressings. If this occurs go ahead and perform a dressing change. The drainage should lessen daily and when there is no drainage from your incisions feel free to go without a dressing.  3. Wear your sling/immobilizer at all times except to perform the exercises below or to occasionally let your arm dangle by your side to stretch your elbow. You also need to sleep in your sling immobilizer until instructed otherwise.  4. Range of motion to your elbow, wrist, and hand are encouraged 3-5 times daily. Exercise to your hand and fingers helps to reduce swelling you may experience.  5.  Use your [employer] ice machine as instructed  6. You may one-armed drive when safely off of narcotics and muscle relaxants. You may use your hand that is in the sling to support the steering wheel only. However, should it be your right arm that is in the sling it is not to be used for gear shifting in a manual transmission.  7. Pain control following an exparel block  To help control your post-operative pain you received a nerve block  performed with Exparel which is a long acting anesthetic (numbing agent) which can provide pain relief and sensations of numbness (and relief of pain) in the operative shoulder and arm for up to 3 days. Sometimes it provides  mixed relief, meaning you may still have numbness in certain areas of the arm but can still be able to move  parts of that arm, hand, and fingers. We recommend that your prescribed pain medications  be used as needed. We do not feel it is necessary to "pre medicate" and "stay ahead" of pain.  Taking narcotic pain medications when you are not having any pain can lead to unnecessary and potentially dangerous side effects.    8. Pain medications can produce constipation along with their use. If you experience this, the use of an over the counter stool softener or laxative daily is recommended.   9. For additional questions or concerns, please do not hesitate to call the office. If after hours there is an answering service to forward your concerns to the physician on call.   POST-OP EXERCISES  Pendulum Exercises  Perform pendulum exercises while standing and bending at the waist. Support your uninvolved arm on a table or chair and allow your operated arm to hang freely. Make sure to do these exercises passively - not using you shoulder muscle.  Repeat 20 times. Do 3 sessions per day.

## 2020-08-31 NOTE — Anesthesia Procedure Notes (Signed)
Procedure Name: Intubation Date/Time: 08/31/2020 12:38 PM Performed by: Lyndal Pulley, CRNA Pre-anesthesia Checklist: Patient identified, Emergency Drugs available, Suction available and Patient being monitored Patient Re-evaluated:Patient Re-evaluated prior to induction Oxygen Delivery Method: Circle system utilized Preoxygenation: Pre-oxygenation with 100% oxygen Induction Type: IV induction Ventilation: Mask ventilation without difficulty Laryngoscope Size: Mac and 4 Grade View: Grade I Tube type: Oral Tube size: 7.5 mm Number of attempts: 1 Airway Equipment and Method: Stylet and Oral airway Placement Confirmation: ETT inserted through vocal cords under direct vision, positive ETCO2 and breath sounds checked- equal and bilateral Secured at: 22 cm Tube secured with: Tape Dental Injury: Teeth and Oropharynx as per pre-operative assessment

## 2020-08-31 NOTE — H&P (Signed)
Wanda English    Chief Complaint: Right shoulder rotator cuff tear, impingement, acromioclavicular osteoarthritis HPI: The patient is a 55 y.o. female with chronic right shoulder pain related to an impingement syndrome, symptomatic AC joint arthritis, as well as a full-thickness rotator cuff tear noted on recent MRI scan.  Due to her increasing functional rotations and failure to respond to prolonged attempts at conservative management, she is brought to the operating this time for planned right shoulder arthroscopic surgery with anticipated rotator cuff repair.  Past Medical History:  Diagnosis Date   Anemia    Arthritis    GERD (gastroesophageal reflux disease)    Hypertension     Past Surgical History:  Procedure Laterality Date   ABDOMINAL HYSTERECTOMY     ANKLE SURGERY     SHOULDER SURGERY Left    TUBAL LIGATION     WISDOM TOOTH EXTRACTION      History reviewed. No pertinent family history.  Social History:  reports that she has never smoked. She has never used smokeless tobacco. She reports that she does not drink alcohol and does not use drugs.   Medications Prior to Admission  Medication Sig Dispense Refill   acetaminophen (TYLENOL) 650 MG CR tablet Take 1,300 mg by mouth every 8 (eight) hours as needed for pain.     amLODipine (NORVASC) 2.5 MG tablet Take 2.5 mg by mouth daily.     diphenhydrAMINE (BENADRYL) 25 MG tablet Take 12.5 mg by mouth daily.     ergocalciferol (VITAMIN D2) 1.25 MG (50000 UT) capsule Take 50,000 Units by mouth every Sunday.     ketoconazole (NIZORAL) 2 % cream Apply 1 application topically daily as needed for irritation.     loratadine (CLARITIN) 10 MG tablet Take 10 mg by mouth daily.     mometasone (NASONEX) 50 MCG/ACT nasal spray Place 2 sprays into the nose daily as needed (allergies).     pantoprazole (PROTONIX) 40 MG tablet Take 40 mg by mouth daily.     albuterol (VENTOLIN HFA) 108 (90 Base) MCG/ACT inhaler Inhale 1-2 puffs into the  lungs every 6 (six) hours as needed for wheezing or shortness of breath.     benzonatate (TESSALON) 100 MG capsule Take 1 capsule (100 mg total) every 8 (eight) hours by mouth. (Patient not taking: No sig reported) 21 capsule 0   cyclobenzaprine (FLEXERIL) 5 MG tablet Take 1 tablet (5 mg total) by mouth 3 (three) times daily as needed (muscle soreness). (Patient not taking: No sig reported) 30 tablet 0   naproxen (NAPROSYN) 500 MG tablet Take 1 po BID with food prn pain (Patient not taking: No sig reported) 30 tablet 0   naproxen (NAPROSYN) 500 MG tablet Take 1 tablet (500 mg total) by mouth 2 (two) times daily. (Patient not taking: No sig reported) 30 tablet 0     Physical Exam: Right shoulder demonstrates painful and guarded motion as noted at the office visits.  She has a markedly positive impingement sign.  Global weakness to manual muscle testing.  Plain radiographs show the joint space to be congruently aligned.  AC joint arthritis as well as lateral downsloping of the acromion are noted.  Recent MRI scan confirms a full-thickness rotator cuff tear.  Vitals  Temp:  [97.8 F (36.6 C)] 97.8 F (36.6 C) (08/11 1016) Pulse Rate:  [92] 92 (08/11 1016) Resp:  [18] 18 (08/11 1016) BP: (171)/(97) 171/97 (08/11 1016) SpO2:  [100 %] 100 % (08/11 1016) Weight:  [139.7 kg]  139.7 kg (08/11 1016)  Assessment/Plan  Impression: Right shoulder rotator cuff tear, impingement, acromioclavicular osteoarthritis  Plan of Action: Procedure(s): Right shoulder arthroscopy, subacromial decompression, distal clavicle resection, rotator cuff repair  Gretel Cantu M Wynee Matarazzo 08/31/2020, 11:33 AM Contact # [phone]

## 2020-08-31 NOTE — Progress Notes (Signed)
Assisted Dr. Rose with right, ultrasound guided, interscalene  block. Side rails up, monitors on throughout procedure. See vital signs in flow sheet. Tolerated Procedure well. 

## 2020-08-31 NOTE — Op Note (Signed)
08/31/2020  2:09 PM  PATIENT:   Wanda English August  55 y.o. female  PRE-OPERATIVE DIAGNOSIS:  Right shoulder rotator cuff tear, impingement, acromioclavicular osteoarthritis  POST-OPERATIVE DIAGNOSIS: Same with operative finding of degenerative labral tear  PROCEDURE:  1.  Right shoulder examination under anesthesia.  2.  Right shoulder glenohumeral joint diagnostic arthroscopy  3.  Debridement of degenerative anterior and superior labral tear  4.  Arthroscopic subacromial decompression and bursectomy  5.  Arthroscopic distal clavicle resection  6.  Arthroscopic rotator cuff repair  SURGEON:  Arsenia Goracke, Vania Rea. M.D.  ASSISTANTS: Ralene Bathe, PA-C  ANESTHESIA:   General endotracheal and interscalene block with Exparel  EBL: Minimal  SPECIMEN: None  Drains: None   PATIENT DISPOSITION:  PACU - hemodynamically stable.    PLAN OF CARE: Discharge to home after PACU  Brief history:  Patient is a 55 year old female who has had chronic and progressively increasing right shoulder pain related to impingement syndrome with subacromial bursitis, symptomatic AC joint arthritis and MRI scan evidence for full-thickness rotator cuff tear.  Due to her ongoing pain and functional rotations and failure to respond to prolonged attempts at conservative management, she is brought to the operating this time for planned right shoulder arthroscopy as described below.  Preoperatively, I counseled the patient regarding treatment options and risks versus benefits thereof.  Possible surgical complications were all reviewed including potential for bleeding, infection, neurovascular injury, persistent pain, loss of motion, anesthetic complication, failure of the implant, recurrence of rotator cuff tear, and possible need for additional surgery. They understand and accept and agrees with our planned procedure.  Procedure detail:  After undergoing routine preop evaluation the patient received  prophylactic antibiotics and interscalene block with Exparel was established in the holding area by the anesthesia department.  Patient was subsequently placed supine on the operating table and underwent the smooth induction of a general endotracheal anesthesia.  Turned to the left lateral decubitus position on a beanbag and appropriately padded and protected.  Right shoulder examination under anesthesia revealed full motion with no obvious instability patterns.  Right arm was then suspended at 70 degrees abduction with 15 pounds of traction the right shoulder girdle region was sterilely prepped and draped in standard fashion.  Timeout was called.  A posterior portal was established into the glenohumeral joint and anterior established under direct visualization.  There were some areas of chondromalacia on the humeral head particularly anterosuperiorly with significant thinning of the articular cartilage.  These areas of chondromalacia were debrided with a shaver.  Degenerative tearing of the anterior and superior labrum was also noted which were debrided with a shaver back to stable margin.  The biceps tendon was stable both proximally and distally with normal caliber.  Rotator cuff showed an obvious full-thickness defect of the supraspinatus.  Remaining inspection of the glenohumeral joint showed no obvious additional pathologies.  At this point fluid and instruments were removed from the glenohumeral joint.  The arm was then dropped down to 30 degrees of abduction with the arthroscope introduced into the subacromial space of the posterior portal and a direct lateral portal was established into the subacromial space.  Abundant dense bursal tissue multiple adhesions were encountered and these were all divided and excised with combination the shaver and the Arthrex wand.  The wand was then used to remove the periosteum from the undersurface of the anterior half of the acromion estimated to the periosteum was very  thickened.  We then used a bur to perform  a subacromial decompression creating a type I morphology.  A portal was then established directly anterior to the distal clavicle and a distal clavicle resection was performed with a bur with care taken to confirm visualization of the entire circumference of the distal [name] to ensure adequate removal of bone.  We then completed the subacromial/subdeltoid bursectomy.  Rotator cuff was readily identified and shaver was used to trim the torn tendon margin back to healthy tissue ultimately footprint approximate 2 cm in width.  The greater tuberosity was then prepared removing soft tissue and abrading the bone to a bleeding bed.  Through a stab wound on the lateral margin of the acromion we placed a Arthrex 4.75 peek swivel lock anchor loaded with 2 fiber tapes.  The 6 suture limbs were then passed through the free margin of the torn rotator cuff tendon using the scorpion suture passer and then the suture limbs were shuttled in an alternating fashion into 2 lateral row anchors creating a double row repair which allowed excellent apposition of the torn rotator cuff tendon against the bony bed and tuberosity and the overall construct was menstrual satisfaction.  Suture limbs were all then clipped.  Final bursectomy was completed.  Fluid and incensed removed.  The portals were closed with a Monocryl and a Steri-Strip.  A dry dressing was taped about the right shoulder and the right arm was placed into a sling immobilizer with abduction pillow and the patient was subsequently awakened, extubated, and taken to the recovery room in stable condition.  Ralene Bathe, PA-C was utilized as an Geophysicist/field seismologist throughout this case, essential for help with positioning the patient, positioning extremity, tissue manipulation, implantation of the prosthesis, suture management, wound closure, and intraoperative decision-making.  Senaida Lange MD    Contact # [phone]

## 2020-08-31 NOTE — Anesthesia Procedure Notes (Signed)
Anesthesia Procedure Image    

## 2020-08-31 NOTE — Progress Notes (Signed)
Patient pain uncontrolled even IV pain meds. Notified T Shuford,PA called wanting Anesthesia to evaluate patient's pain needs. Dr. Renold Don at bedside evaluated block with more local needed. 9ml of local anesthetic given over 5 minutes starting at 1550 to 1555 by Dr. Renold Don. Vital signs charted. Patient stable throughout procedure and tolerated well.

## 2020-08-31 NOTE — Anesthesia Procedure Notes (Signed)
Anesthesia Regional Block: Interscalene brachial plexus block   Pre-Anesthetic Checklist: , timeout performed,  Correct Patient, Correct Site, Correct Laterality,  Correct Procedure, Correct Position, site marked,  Risks and benefits discussed,  Surgical consent,  Pre-op evaluation,  At surgeon's request and post-op pain management  Laterality: Upper and Right  Prep: chloraprep       Needles:  Injection technique: Single-shot  Needle Type: Stimulator Needle - 40     Needle Length: 4cm  Needle Gauge: 22     Additional Needles:   Procedures:,,,, ultrasound used (permanent image in chart),,    Narrative:  Start time: 08/31/2020 3:45 PM End time: 08/31/2020 4:00 PM Injection made incrementally with aspirations every 5 mL.  Performed by: Personally  Anesthesiologist: Lewie Loron, MD  Additional Notes: BP cuff, SpO2 and EKG monitors applied. Sedation begun. Nerve location verified with ultrasound. Anesthetic injected incrementally, slowly, and after neg aspirations under direct u/s guidance. Good perineural spread. Tolerated well.

## 2020-08-31 NOTE — Anesthesia Preprocedure Evaluation (Signed)
Anesthesia Evaluation  Patient identified by MRN, date of birth, ID band Patient awake    Reviewed: Allergy & Precautions, NPO status , Patient's Chart, lab work & pertinent test results  Airway Mallampati: I  TM Distance: >3 FB Neck ROM: Full    Dental no notable dental hx.    Pulmonary neg pulmonary ROS,    Pulmonary exam normal breath sounds clear to auscultation       Cardiovascular hypertension, Pt. on medications Normal cardiovascular exam Rhythm:Regular Rate:Normal     Neuro/Psych negative neurological ROS  negative psych ROS   GI/Hepatic Neg liver ROS, GERD  Medicated,  Endo/Other  Morbid obesity  Renal/GU negative Renal ROS  negative genitourinary   Musculoskeletal negative musculoskeletal ROS (+)   Abdominal   Peds negative pediatric ROS (+)  Hematology negative hematology ROS (+)   Anesthesia Other Findings   Reproductive/Obstetrics negative OB ROS                             Anesthesia Physical Anesthesia Plan  ASA: 3  Anesthesia Plan: General   Post-op Pain Management:  Regional for Post-op pain   Induction: Intravenous  PONV Risk Score and Plan: 3 and Ondansetron, Dexamethasone, Midazolam and Treatment may vary due to age or medical condition  Airway Management Planned: Oral ETT  Additional Equipment:   Intra-op Plan:   Post-operative Plan: Extubation in OR  Informed Consent: I have reviewed the patients History and Physical, chart, labs and discussed the procedure including the risks, benefits and alternatives for the proposed anesthesia with the patient or authorized representative who has indicated his/her understanding and acceptance.     Dental advisory given  Plan Discussed with: CRNA and Surgeon  Anesthesia Plan Comments:         Anesthesia Quick Evaluation

## 2020-09-01 NOTE — Anesthesia Postprocedure Evaluation (Signed)
Anesthesia Post Note  Patient: Wanda English  Procedure(s) Performed: Right shoulder arthroscopy, subacromial decompression, distal clavicle resection, rotator cuff repair and labral debridement (Right: Shoulder)     Patient location during evaluation: PACU Anesthesia Type: General Level of consciousness: awake and alert Pain management: pain level controlled Vital Signs Assessment: post-procedure vital signs reviewed and stable Respiratory status: spontaneous breathing, nonlabored ventilation, respiratory function stable and patient connected to nasal cannula oxygen Cardiovascular status: blood pressure returned to baseline and stable Postop Assessment: no apparent nausea or vomiting Anesthetic complications: no   No notable events documented.  Last Vitals:  Vitals:   08/31/20 1645 08/31/20 1700  BP: (!) 145/83 134/82  Pulse: 93 98  Resp:    Temp: (!) 36.3 C   SpO2: 93% 94%    Last Pain:  Vitals:   08/31/20 1640  TempSrc: Oral  PainSc: 3                  Madysyn Hanken S

## 2020-09-04 ENCOUNTER — Encounter (HOSPITAL_COMMUNITY): Payer: Self-pay | Admitting: Orthopedic Surgery

## 2021-05-11 ENCOUNTER — Other Ambulatory Visit: Payer: Self-pay | Admitting: Family Medicine

## 2021-05-11 DIAGNOSIS — Z1231 Encounter for screening mammogram for malignant neoplasm of breast: Secondary | ICD-10-CM

## 2021-05-25 ENCOUNTER — Ambulatory Visit: Payer: BC Managed Care – PPO

## 2021-05-29 ENCOUNTER — Ambulatory Visit
Admission: RE | Admit: 2021-05-29 | Discharge: 2021-05-29 | Disposition: A | Discharge status: Home / Self Care | Payer: BC Managed Care – PPO | Source: Ambulatory Visit | Attending: Family Medicine | Admitting: Family Medicine

## 2021-05-29 DIAGNOSIS — Z1231 Encounter for screening mammogram for malignant neoplasm of breast: Secondary | ICD-10-CM

## 2022-01-20 ENCOUNTER — Encounter (HOSPITAL_BASED_OUTPATIENT_CLINIC_OR_DEPARTMENT_OTHER): Payer: Self-pay

## 2022-01-20 ENCOUNTER — Other Ambulatory Visit: Payer: Self-pay

## 2022-01-20 ENCOUNTER — Emergency Department (HOSPITAL_BASED_OUTPATIENT_CLINIC_OR_DEPARTMENT_OTHER)
Admission: EM | Admit: 2022-01-20 | Discharge: 2022-01-20 | Disposition: A | Discharge status: Home / Self Care | Payer: BC Managed Care – PPO | Attending: Emergency Medicine | Admitting: Emergency Medicine

## 2022-01-20 DIAGNOSIS — J40 Bronchitis, not specified as acute or chronic: Secondary | ICD-10-CM | POA: Insufficient documentation

## 2022-01-20 DIAGNOSIS — R059 Cough, unspecified: Secondary | ICD-10-CM | POA: Diagnosis present

## 2022-01-20 DIAGNOSIS — Z1152 Encounter for screening for COVID-19: Secondary | ICD-10-CM | POA: Diagnosis not present

## 2022-01-20 DIAGNOSIS — Z79899 Other long term (current) drug therapy: Secondary | ICD-10-CM | POA: Insufficient documentation

## 2022-01-20 LAB — RESP PANEL BY RT-PCR (RSV, FLU A&B, COVID)  RVPGX2
Influenza A by PCR: NEGATIVE
Influenza B by PCR: NEGATIVE
Resp Syncytial Virus by PCR: NEGATIVE
SARS Coronavirus 2 by RT PCR: NEGATIVE

## 2022-01-20 MED ORDER — ALBUTEROL SULFATE HFA 108 (90 BASE) MCG/ACT IN AERS: 1.0000 | INHALATION_SPRAY | Freq: Four times a day (QID) | RESPIRATORY_TRACT | 0 refills | Status: AC | PRN

## 2022-01-20 MED ORDER — PREDNISONE 50 MG PO TABS
60.0000 mg | ORAL_TABLET | Freq: Once | ORAL | Status: AC
  Administered 2022-01-20: 60 mg via ORAL
  Filled 2022-01-20: qty 1

## 2022-01-20 MED ORDER — PREDNISONE 10 MG (21) PO TBPK: ORAL_TABLET | ORAL | 0 refills | Status: AC

## 2022-01-20 NOTE — ED Provider Notes (Signed)
[facility] EMERGENCY DEPT  Provider Note  CSN: [id number] Arrival date & time: 01/20/22 0300  History Chief Complaint  Patient presents with   Cough    Wanda English is a 56 y.o. female with history of bronchitis with inhaler use PRN reports she has been sick for about a week, multiple household members positive for flu in the last week. She has had persistent cough, chest soreness. No fever.   Home Medications Prior to Admission medications   Medication Sig Start Date End Date Taking? Authorizing Provider  predniSONE (STERAPRED UNI-PAK 21 TAB) 10 MG (21) TBPK tablet 10mg  Tabs, 6 day taper. Use as directed 01/20/22  Yes 01/22/22, MD  acetaminophen (TYLENOL) 650 MG CR tablet Take 1,300 mg by mouth every 8 (eight) hours as needed for pain.    [provider]  albuterol (VENTOLIN HFA) 108 (90 Base) MCG/ACT inhaler Inhale 1-2 puffs into the lungs every 6 (six) hours as needed for wheezing or shortness of breath. 01/20/22   01/22/22, MD  amLODipine (NORVASC) 2.5 MG tablet Take 2.5 mg by mouth daily.    [provider]  cyclobenzaprine (FLEXERIL) 10 MG tablet Take 1 tablet (10 mg total) by mouth 3 (three) times daily as needed for muscle spasms. 08/31/20   Shuford, 10/31/20, PA-C  diphenhydrAMINE (BENADRYL) 25 MG tablet Take 12.5 mg by mouth daily.    [provider]  ergocalciferol (VITAMIN D2) 1.25 MG (50000 UT) capsule Take 50,000 Units by mouth every Sunday.    [provider]  ketoconazole (NIZORAL) 2 % cream Apply 1 application topically daily as needed for irritation. 08/08/20   [provider]  loratadine (CLARITIN) 10 MG tablet Take 10 mg by mouth daily.    [provider]  mometasone (NASONEX) 50 MCG/ACT nasal spray Place 2 sprays into the nose daily as needed (allergies).    [provider]  naproxen (NAPROSYN) 500 MG tablet Take 1 tablet (500 mg total) by mouth 2 (two) times daily  with a meal. 08/31/20   Shuford, 10/31/20, PA-C  ondansetron (ZOFRAN) 4 MG tablet Take 1 tablet (4 mg total) by mouth every 8 (eight) hours as needed for nausea or vomiting. 08/31/20   Shuford, 10/31/20, PA-C  oxyCODONE-acetaminophen (PERCOCET) 5-325 MG tablet Take 1 tablet by mouth every 4 (four) hours as needed (max 6 q). 08/31/20   Shuford, 10/31/20, PA-C  pantoprazole (PROTONIX) 40 MG tablet Take 40 mg by mouth daily.    [provider]     Allergies    Iodinated contrast media and Tomato   Review of Systems   Review of Systems Please see HPI for pertinent positives and negatives  Physical Exam BP (!) 175/96   Pulse 99   Temp 98.6 F (37 C)   Resp 18   Ht 5\' 11"  (1.803 m)   Wt 133.8 kg   SpO2 96%   BMI 41.14 kg/m   Physical Exam Vitals and nursing note reviewed.  Constitutional:      Appearance: Normal appearance.  HENT:     Head: Normocephalic and atraumatic.     Nose: Nose normal.     Mouth/Throat:     Mouth: Mucous membranes are moist.  Eyes:     Extraocular Movements: Extraocular movements intact.     Conjunctiva/sclera: Conjunctivae normal.  Cardiovascular:     Rate and Rhythm: Normal rate.  Pulmonary:     Effort: Pulmonary effort is normal.     Breath sounds:  Normal breath sounds. No wheezing or rales.     Comments: Harsh cough Abdominal:     General: Abdomen is flat.     Palpations: Abdomen is soft.     Tenderness: There is no abdominal tenderness.  Musculoskeletal:        General: No swelling. Normal range of motion.     Cervical back: Neck supple.  Skin:    General: Skin is warm and dry.  Neurological:     General: No focal deficit present.     Mental Status: She is alert.  Psychiatric:        Mood and Affect: Mood normal.     ED Results / Procedures / Treatments   EKG None  Procedures Procedures  Medications Ordered in the ED Medications  predniSONE (DELTASONE) tablet 60 mg (has no administration in time range)    Initial Impression  and Plan  Patient here with about a week of URI, multiple family members with influenza although her swab today is neg. Regardless, discussed that Abx are not effective at making flu better. Will Rx prednisone for bronchitis, refill albuterol. Otherwise she is well appearing in no distress with reassuring vitals and exam. PCP follow up, RTED for any other concerns.    ED Course       MDM Rules/Calculators/A&P Medical Decision Making Problems Addressed: Bronchitis: acute illness or injury  Amount and/or Complexity of Data Reviewed Labs: ordered. Decision-making details documented in ED Course.  Risk Prescription drug management.    Final Clinical Impression(s) / ED Diagnoses Final diagnoses:  Bronchitis    Rx / DC Orders ED Discharge Orders          Ordered    predniSONE (STERAPRED UNI-PAK 21 TAB) 10 MG (21) TBPK tablet        01/20/22 0537    albuterol (VENTOLIN HFA) 108 (90 Base) MCG/ACT inhaler  Every 6 hours PRN        01/20/22 0537             Pollyann Savoy, MD 01/20/22 [phone]

## 2022-01-20 NOTE — ED Triage Notes (Signed)
POV from home, pt sts that she's been having cough and congestion x one week. Family has been sick with the flu. Pt sts coughing up yellow mucous. Amb, A&O x 4, NAD.

## 2022-04-29 ENCOUNTER — Other Ambulatory Visit: Payer: Self-pay | Admitting: Family Medicine

## 2022-04-29 DIAGNOSIS — Z0000 Encounter for general adult medical examination without abnormal findings: Secondary | ICD-10-CM

## 2022-05-13 ENCOUNTER — Other Ambulatory Visit: Payer: Self-pay | Admitting: Family Medicine

## 2022-05-13 ENCOUNTER — Ambulatory Visit
Admission: RE | Admit: 2022-05-13 | Discharge: 2022-05-13 | Disposition: A | Discharge status: Home / Self Care | Payer: BC Managed Care – PPO | Source: Ambulatory Visit | Attending: Family Medicine | Admitting: Family Medicine

## 2022-05-13 DIAGNOSIS — R0781 Pleurodynia: Secondary | ICD-10-CM

## 2022-06-05 ENCOUNTER — Ambulatory Visit
Admission: RE | Admit: 2022-06-05 | Discharge: 2022-06-05 | Disposition: A | Discharge status: Home / Self Care | Payer: BC Managed Care – PPO | Source: Ambulatory Visit | Attending: Family Medicine | Admitting: Family Medicine

## 2022-06-05 DIAGNOSIS — Z0000 Encounter for general adult medical examination without abnormal findings: Secondary | ICD-10-CM

## 2022-11-23 DIAGNOSIS — S8991XA Unspecified injury of right lower leg, initial encounter: Secondary | ICD-10-CM | POA: Diagnosis not present

## 2022-11-23 DIAGNOSIS — I10 Essential (primary) hypertension: Secondary | ICD-10-CM | POA: Diagnosis not present

## 2022-11-27 DIAGNOSIS — M2391 Unspecified internal derangement of right knee: Secondary | ICD-10-CM | POA: Diagnosis not present

## 2022-11-27 DIAGNOSIS — S8991XA Unspecified injury of right lower leg, initial encounter: Secondary | ICD-10-CM | POA: Diagnosis not present

## 2022-12-04 DIAGNOSIS — M7989 Other specified soft tissue disorders: Secondary | ICD-10-CM | POA: Diagnosis not present

## 2022-12-04 DIAGNOSIS — M1711 Unilateral primary osteoarthritis, right knee: Secondary | ICD-10-CM | POA: Diagnosis not present

## 2022-12-04 DIAGNOSIS — S83511A Sprain of anterior cruciate ligament of right knee, initial encounter: Secondary | ICD-10-CM | POA: Diagnosis not present

## 2022-12-04 DIAGNOSIS — S83241A Other tear of medial meniscus, current injury, right knee, initial encounter: Secondary | ICD-10-CM | POA: Diagnosis not present

## 2022-12-09 DIAGNOSIS — M2391 Unspecified internal derangement of right knee: Secondary | ICD-10-CM | POA: Diagnosis not present

## 2022-12-09 DIAGNOSIS — L03116 Cellulitis of left lower limb: Secondary | ICD-10-CM | POA: Diagnosis not present

## 2022-12-13 DIAGNOSIS — L03116 Cellulitis of left lower limb: Secondary | ICD-10-CM | POA: Diagnosis not present

## 2023-01-02 DIAGNOSIS — L03116 Cellulitis of left lower limb: Secondary | ICD-10-CM | POA: Diagnosis not present

## 2023-01-02 DIAGNOSIS — M7989 Other specified soft tissue disorders: Secondary | ICD-10-CM | POA: Diagnosis not present

## 2023-01-02 DIAGNOSIS — E785 Hyperlipidemia, unspecified: Secondary | ICD-10-CM | POA: Diagnosis not present

## 2023-01-02 DIAGNOSIS — Z1211 Encounter for screening for malignant neoplasm of colon: Secondary | ICD-10-CM | POA: Diagnosis not present

## 2023-01-02 DIAGNOSIS — I10 Essential (primary) hypertension: Secondary | ICD-10-CM | POA: Diagnosis not present

## 2023-01-02 DIAGNOSIS — Z131 Encounter for screening for diabetes mellitus: Secondary | ICD-10-CM | POA: Diagnosis not present

## 2023-01-02 DIAGNOSIS — Z0000 Encounter for general adult medical examination without abnormal findings: Secondary | ICD-10-CM | POA: Diagnosis not present

## 2023-01-06 DIAGNOSIS — M79662 Pain in left lower leg: Secondary | ICD-10-CM | POA: Diagnosis not present

## 2023-01-06 DIAGNOSIS — M7989 Other specified soft tissue disorders: Secondary | ICD-10-CM | POA: Diagnosis not present

## 2023-01-06 DIAGNOSIS — M2391 Unspecified internal derangement of right knee: Secondary | ICD-10-CM | POA: Diagnosis not present

## 2023-01-31 DIAGNOSIS — M7989 Other specified soft tissue disorders: Secondary | ICD-10-CM | POA: Diagnosis not present

## 2023-01-31 DIAGNOSIS — M79662 Pain in left lower leg: Secondary | ICD-10-CM | POA: Diagnosis not present

## 2023-02-06 DIAGNOSIS — N179 Acute kidney failure, unspecified: Secondary | ICD-10-CM | POA: Diagnosis not present

## 2023-02-06 DIAGNOSIS — Z789 Other specified health status: Secondary | ICD-10-CM | POA: Diagnosis not present

## 2023-02-06 DIAGNOSIS — M7989 Other specified soft tissue disorders: Secondary | ICD-10-CM | POA: Diagnosis not present

## 2023-02-06 DIAGNOSIS — I10 Essential (primary) hypertension: Secondary | ICD-10-CM | POA: Diagnosis not present

## 2023-02-20 DIAGNOSIS — M7989 Other specified soft tissue disorders: Secondary | ICD-10-CM | POA: Diagnosis not present

## 2023-02-20 DIAGNOSIS — R600 Localized edema: Secondary | ICD-10-CM | POA: Diagnosis not present

## 2023-02-24 DIAGNOSIS — M1711 Unilateral primary osteoarthritis, right knee: Secondary | ICD-10-CM | POA: Diagnosis not present

## 2023-02-26 DIAGNOSIS — R600 Localized edema: Secondary | ICD-10-CM | POA: Diagnosis not present

## 2023-02-26 DIAGNOSIS — M7989 Other specified soft tissue disorders: Secondary | ICD-10-CM | POA: Diagnosis not present

## 2023-02-26 DIAGNOSIS — M79662 Pain in left lower leg: Secondary | ICD-10-CM | POA: Diagnosis not present

## 2023-02-26 DIAGNOSIS — I872 Venous insufficiency (chronic) (peripheral): Secondary | ICD-10-CM | POA: Diagnosis not present

## 2023-03-20 DIAGNOSIS — M5441 Lumbago with sciatica, right side: Secondary | ICD-10-CM | POA: Diagnosis not present

## 2023-03-20 DIAGNOSIS — M5117 Intervertebral disc disorders with radiculopathy, lumbosacral region: Secondary | ICD-10-CM | POA: Diagnosis not present

## 2023-03-20 DIAGNOSIS — M4727 Other spondylosis with radiculopathy, lumbosacral region: Secondary | ICD-10-CM | POA: Diagnosis not present

## 2023-03-20 DIAGNOSIS — G8929 Other chronic pain: Secondary | ICD-10-CM | POA: Diagnosis not present

## 2023-03-20 DIAGNOSIS — Z6841 Body Mass Index (BMI) 40.0 and over, adult: Secondary | ICD-10-CM | POA: Diagnosis not present

## 2023-03-20 DIAGNOSIS — M4316 Spondylolisthesis, lumbar region: Secondary | ICD-10-CM | POA: Diagnosis not present

## 2023-03-20 DIAGNOSIS — E559 Vitamin D deficiency, unspecified: Secondary | ICD-10-CM | POA: Diagnosis not present

## 2023-03-20 DIAGNOSIS — M51362 Other intervertebral disc degeneration, lumbar region with discogenic back pain and lower extremity pain: Secondary | ICD-10-CM | POA: Diagnosis not present

## 2023-03-20 DIAGNOSIS — M5116 Intervertebral disc disorders with radiculopathy, lumbar region: Secondary | ICD-10-CM | POA: Diagnosis not present

## 2023-03-20 DIAGNOSIS — I10 Essential (primary) hypertension: Secondary | ICD-10-CM | POA: Diagnosis not present

## 2023-03-20 DIAGNOSIS — E785 Hyperlipidemia, unspecified: Secondary | ICD-10-CM | POA: Diagnosis not present

## 2023-03-20 DIAGNOSIS — E66813 Obesity, class 3: Secondary | ICD-10-CM | POA: Diagnosis not present

## 2023-03-24 DIAGNOSIS — S46001A Unspecified injury of muscle(s) and tendon(s) of the rotator cuff of right shoulder, initial encounter: Secondary | ICD-10-CM | POA: Diagnosis not present

## 2023-03-24 DIAGNOSIS — M25511 Pain in right shoulder: Secondary | ICD-10-CM | POA: Diagnosis not present

## 2023-03-28 DIAGNOSIS — M7581 Other shoulder lesions, right shoulder: Secondary | ICD-10-CM | POA: Diagnosis not present

## 2023-03-28 DIAGNOSIS — M75101 Unspecified rotator cuff tear or rupture of right shoulder, not specified as traumatic: Secondary | ICD-10-CM | POA: Diagnosis not present

## 2023-03-28 DIAGNOSIS — S46001A Unspecified injury of muscle(s) and tendon(s) of the rotator cuff of right shoulder, initial encounter: Secondary | ICD-10-CM | POA: Diagnosis not present

## 2023-04-09 DIAGNOSIS — M7591 Shoulder lesion, unspecified, right shoulder: Secondary | ICD-10-CM | POA: Diagnosis not present

## 2023-04-09 DIAGNOSIS — M62830 Muscle spasm of back: Secondary | ICD-10-CM | POA: Diagnosis not present

## 2023-04-24 DIAGNOSIS — Z6841 Body Mass Index (BMI) 40.0 and over, adult: Secondary | ICD-10-CM | POA: Diagnosis not present

## 2023-04-24 DIAGNOSIS — Z79899 Other long term (current) drug therapy: Secondary | ICD-10-CM | POA: Diagnosis not present

## 2023-04-24 DIAGNOSIS — R634 Abnormal weight loss: Secondary | ICD-10-CM | POA: Diagnosis not present

## 2023-05-07 DIAGNOSIS — E559 Vitamin D deficiency, unspecified: Secondary | ICD-10-CM | POA: Diagnosis not present

## 2023-05-07 DIAGNOSIS — Z1211 Encounter for screening for malignant neoplasm of colon: Secondary | ICD-10-CM | POA: Diagnosis not present

## 2023-05-07 DIAGNOSIS — Z1231 Encounter for screening mammogram for malignant neoplasm of breast: Secondary | ICD-10-CM | POA: Diagnosis not present

## 2023-05-07 DIAGNOSIS — G8929 Other chronic pain: Secondary | ICD-10-CM | POA: Diagnosis not present

## 2023-05-07 DIAGNOSIS — M5441 Lumbago with sciatica, right side: Secondary | ICD-10-CM | POA: Diagnosis not present

## 2023-05-07 DIAGNOSIS — I10 Essential (primary) hypertension: Secondary | ICD-10-CM | POA: Diagnosis not present

## 2023-05-29 DIAGNOSIS — Z6841 Body Mass Index (BMI) 40.0 and over, adult: Secondary | ICD-10-CM | POA: Diagnosis not present

## 2023-06-09 DIAGNOSIS — N898 Other specified noninflammatory disorders of vagina: Secondary | ICD-10-CM | POA: Diagnosis not present

## 2023-06-09 DIAGNOSIS — I10 Essential (primary) hypertension: Secondary | ICD-10-CM | POA: Diagnosis not present

## 2023-06-09 DIAGNOSIS — Z124 Encounter for screening for malignant neoplasm of cervix: Secondary | ICD-10-CM | POA: Diagnosis not present

## 2023-06-10 DIAGNOSIS — Z1231 Encounter for screening mammogram for malignant neoplasm of breast: Secondary | ICD-10-CM | POA: Diagnosis not present

## 2023-06-10 DIAGNOSIS — M1711 Unilateral primary osteoarthritis, right knee: Secondary | ICD-10-CM | POA: Diagnosis not present

## 2023-06-23 DIAGNOSIS — R262 Difficulty in walking, not elsewhere classified: Secondary | ICD-10-CM | POA: Diagnosis not present

## 2023-06-23 DIAGNOSIS — G8929 Other chronic pain: Secondary | ICD-10-CM | POA: Diagnosis not present

## 2023-06-23 DIAGNOSIS — M6281 Muscle weakness (generalized): Secondary | ICD-10-CM | POA: Diagnosis not present

## 2023-06-23 DIAGNOSIS — M5441 Lumbago with sciatica, right side: Secondary | ICD-10-CM | POA: Diagnosis not present

## 2023-06-26 DIAGNOSIS — M7989 Other specified soft tissue disorders: Secondary | ICD-10-CM | POA: Diagnosis not present

## 2023-06-26 DIAGNOSIS — I890 Lymphedema, not elsewhere classified: Secondary | ICD-10-CM | POA: Diagnosis not present

## 2023-06-26 DIAGNOSIS — M79662 Pain in left lower leg: Secondary | ICD-10-CM | POA: Diagnosis not present

## 2023-07-09 DIAGNOSIS — M5441 Lumbago with sciatica, right side: Secondary | ICD-10-CM | POA: Diagnosis not present

## 2023-07-09 DIAGNOSIS — G8929 Other chronic pain: Secondary | ICD-10-CM | POA: Diagnosis not present

## 2023-07-09 DIAGNOSIS — R262 Difficulty in walking, not elsewhere classified: Secondary | ICD-10-CM | POA: Diagnosis not present

## 2023-07-09 DIAGNOSIS — M6281 Muscle weakness (generalized): Secondary | ICD-10-CM | POA: Diagnosis not present

## 2023-07-16 DIAGNOSIS — I10 Essential (primary) hypertension: Secondary | ICD-10-CM | POA: Diagnosis not present

## 2023-07-21 DIAGNOSIS — R262 Difficulty in walking, not elsewhere classified: Secondary | ICD-10-CM | POA: Diagnosis not present

## 2023-07-21 DIAGNOSIS — G8929 Other chronic pain: Secondary | ICD-10-CM | POA: Diagnosis not present

## 2023-07-21 DIAGNOSIS — M6281 Muscle weakness (generalized): Secondary | ICD-10-CM | POA: Diagnosis not present

## 2023-07-21 DIAGNOSIS — M5441 Lumbago with sciatica, right side: Secondary | ICD-10-CM | POA: Diagnosis not present

## 2023-07-23 DIAGNOSIS — Z79899 Other long term (current) drug therapy: Secondary | ICD-10-CM | POA: Diagnosis not present

## 2023-07-23 DIAGNOSIS — Z1211 Encounter for screening for malignant neoplasm of colon: Secondary | ICD-10-CM | POA: Diagnosis not present

## 2023-07-23 DIAGNOSIS — G8929 Other chronic pain: Secondary | ICD-10-CM | POA: Diagnosis not present

## 2023-07-23 DIAGNOSIS — Z1212 Encounter for screening for malignant neoplasm of rectum: Secondary | ICD-10-CM | POA: Diagnosis not present

## 2023-07-23 DIAGNOSIS — Z91018 Allergy to other foods: Secondary | ICD-10-CM | POA: Diagnosis not present

## 2023-07-23 DIAGNOSIS — E66813 Obesity, class 3: Secondary | ICD-10-CM | POA: Diagnosis not present

## 2023-07-23 DIAGNOSIS — K5730 Diverticulosis of large intestine without perforation or abscess without bleeding: Secondary | ICD-10-CM | POA: Diagnosis not present

## 2023-07-23 DIAGNOSIS — K648 Other hemorrhoids: Secondary | ICD-10-CM | POA: Diagnosis not present

## 2023-07-23 DIAGNOSIS — I10 Essential (primary) hypertension: Secondary | ICD-10-CM | POA: Diagnosis not present

## 2023-07-23 DIAGNOSIS — K644 Residual hemorrhoidal skin tags: Secondary | ICD-10-CM | POA: Diagnosis not present

## 2023-07-23 DIAGNOSIS — Z6841 Body Mass Index (BMI) 40.0 and over, adult: Secondary | ICD-10-CM | POA: Diagnosis not present

## 2023-07-23 DIAGNOSIS — Z91041 Radiographic dye allergy status: Secondary | ICD-10-CM | POA: Diagnosis not present

## 2023-07-24 DIAGNOSIS — M5441 Lumbago with sciatica, right side: Secondary | ICD-10-CM | POA: Diagnosis not present

## 2023-07-24 DIAGNOSIS — M6281 Muscle weakness (generalized): Secondary | ICD-10-CM | POA: Diagnosis not present

## 2023-07-24 DIAGNOSIS — R262 Difficulty in walking, not elsewhere classified: Secondary | ICD-10-CM | POA: Diagnosis not present

## 2023-07-24 DIAGNOSIS — G8929 Other chronic pain: Secondary | ICD-10-CM | POA: Diagnosis not present

## 2023-07-29 DIAGNOSIS — M6281 Muscle weakness (generalized): Secondary | ICD-10-CM | POA: Diagnosis not present

## 2023-07-29 DIAGNOSIS — G8929 Other chronic pain: Secondary | ICD-10-CM | POA: Diagnosis not present

## 2023-07-29 DIAGNOSIS — M5441 Lumbago with sciatica, right side: Secondary | ICD-10-CM | POA: Diagnosis not present

## 2023-07-29 DIAGNOSIS — R262 Difficulty in walking, not elsewhere classified: Secondary | ICD-10-CM | POA: Diagnosis not present

## 2023-07-31 DIAGNOSIS — M6281 Muscle weakness (generalized): Secondary | ICD-10-CM | POA: Diagnosis not present

## 2023-07-31 DIAGNOSIS — M5441 Lumbago with sciatica, right side: Secondary | ICD-10-CM | POA: Diagnosis not present

## 2023-07-31 DIAGNOSIS — G8929 Other chronic pain: Secondary | ICD-10-CM | POA: Diagnosis not present

## 2023-07-31 DIAGNOSIS — R262 Difficulty in walking, not elsewhere classified: Secondary | ICD-10-CM | POA: Diagnosis not present

## 2023-08-12 DIAGNOSIS — G8929 Other chronic pain: Secondary | ICD-10-CM | POA: Diagnosis not present

## 2023-08-12 DIAGNOSIS — R262 Difficulty in walking, not elsewhere classified: Secondary | ICD-10-CM | POA: Diagnosis not present

## 2023-08-12 DIAGNOSIS — M5441 Lumbago with sciatica, right side: Secondary | ICD-10-CM | POA: Diagnosis not present

## 2023-08-12 DIAGNOSIS — M6281 Muscle weakness (generalized): Secondary | ICD-10-CM | POA: Diagnosis not present

## 2023-08-19 DIAGNOSIS — G8929 Other chronic pain: Secondary | ICD-10-CM | POA: Diagnosis not present

## 2023-08-19 DIAGNOSIS — M5441 Lumbago with sciatica, right side: Secondary | ICD-10-CM | POA: Diagnosis not present

## 2023-08-19 DIAGNOSIS — M6281 Muscle weakness (generalized): Secondary | ICD-10-CM | POA: Diagnosis not present

## 2023-08-19 DIAGNOSIS — R262 Difficulty in walking, not elsewhere classified: Secondary | ICD-10-CM | POA: Diagnosis not present

## 2023-08-21 DIAGNOSIS — G8929 Other chronic pain: Secondary | ICD-10-CM | POA: Diagnosis not present

## 2023-08-21 DIAGNOSIS — M6281 Muscle weakness (generalized): Secondary | ICD-10-CM | POA: Diagnosis not present

## 2023-08-21 DIAGNOSIS — M5441 Lumbago with sciatica, right side: Secondary | ICD-10-CM | POA: Diagnosis not present

## 2023-08-21 DIAGNOSIS — R262 Difficulty in walking, not elsewhere classified: Secondary | ICD-10-CM | POA: Diagnosis not present

## 2023-08-26 DIAGNOSIS — M6281 Muscle weakness (generalized): Secondary | ICD-10-CM | POA: Diagnosis not present

## 2023-08-26 DIAGNOSIS — G8929 Other chronic pain: Secondary | ICD-10-CM | POA: Diagnosis not present

## 2023-08-26 DIAGNOSIS — R262 Difficulty in walking, not elsewhere classified: Secondary | ICD-10-CM | POA: Diagnosis not present

## 2023-08-26 DIAGNOSIS — M5441 Lumbago with sciatica, right side: Secondary | ICD-10-CM | POA: Diagnosis not present

## 2023-09-02 DIAGNOSIS — M5441 Lumbago with sciatica, right side: Secondary | ICD-10-CM | POA: Diagnosis not present

## 2023-09-02 DIAGNOSIS — M6281 Muscle weakness (generalized): Secondary | ICD-10-CM | POA: Diagnosis not present

## 2023-09-02 DIAGNOSIS — G8929 Other chronic pain: Secondary | ICD-10-CM | POA: Diagnosis not present

## 2023-09-02 DIAGNOSIS — R262 Difficulty in walking, not elsewhere classified: Secondary | ICD-10-CM | POA: Diagnosis not present

## 2023-09-13 DIAGNOSIS — R21 Rash and other nonspecific skin eruption: Secondary | ICD-10-CM | POA: Diagnosis not present

## 2023-10-22 DIAGNOSIS — H524 Presbyopia: Secondary | ICD-10-CM | POA: Diagnosis not present

## 2023-10-22 DIAGNOSIS — H43392 Other vitreous opacities, left eye: Secondary | ICD-10-CM | POA: Diagnosis not present

## 2023-10-22 DIAGNOSIS — H40013 Open angle with borderline findings, low risk, bilateral: Secondary | ICD-10-CM | POA: Diagnosis not present

## 2023-10-22 DIAGNOSIS — H04123 Dry eye syndrome of bilateral lacrimal glands: Secondary | ICD-10-CM | POA: Diagnosis not present

## 2023-10-22 DIAGNOSIS — H52223 Regular astigmatism, bilateral: Secondary | ICD-10-CM | POA: Diagnosis not present

## 2023-10-22 DIAGNOSIS — H5213 Myopia, bilateral: Secondary | ICD-10-CM | POA: Diagnosis not present

## 2023-10-22 DIAGNOSIS — H2513 Age-related nuclear cataract, bilateral: Secondary | ICD-10-CM | POA: Diagnosis not present

## 2023-11-10 DIAGNOSIS — R21 Rash and other nonspecific skin eruption: Secondary | ICD-10-CM | POA: Diagnosis not present

## 2023-11-28 DIAGNOSIS — M1711 Unilateral primary osteoarthritis, right knee: Secondary | ICD-10-CM | POA: Diagnosis not present

## 2023-11-28 DIAGNOSIS — G8929 Other chronic pain: Secondary | ICD-10-CM | POA: Diagnosis not present

## 2023-11-28 DIAGNOSIS — M545 Low back pain, unspecified: Secondary | ICD-10-CM | POA: Diagnosis not present

## 2023-11-28 DIAGNOSIS — M170 Bilateral primary osteoarthritis of knee: Secondary | ICD-10-CM | POA: Diagnosis not present
